# Patient Record
Sex: Male | Born: 1985 | Race: Black or African American | Hispanic: No | Marital: Single | Smoking: Current every day smoker
Health system: Southern US, Community
[De-identification: ages and names within clinical notes are randomized; demographics above are authoritative.]

## PROBLEM LIST (undated history)

## (undated) DIAGNOSIS — Z789 Other specified health status: Secondary | ICD-10-CM

## (undated) HISTORY — PX: LEG SURGERY: SHX1003

---

## 2008-03-03 ENCOUNTER — Emergency Department (HOSPITAL_COMMUNITY): Admission: EM | Admit: 2008-03-03 | Discharge: 2008-03-03 | Payer: Self-pay | Admitting: Emergency Medicine

## 2009-06-22 ENCOUNTER — Emergency Department (HOSPITAL_COMMUNITY): Admission: EM | Admit: 2009-06-22 | Discharge: 2009-06-23 | Payer: Self-pay | Admitting: Emergency Medicine

## 2020-02-05 ENCOUNTER — Other Ambulatory Visit: Payer: Self-pay

## 2020-02-05 ENCOUNTER — Encounter (HOSPITAL_COMMUNITY): Payer: Self-pay | Admitting: Emergency Medicine

## 2020-02-05 ENCOUNTER — Emergency Department (HOSPITAL_COMMUNITY)
Admission: EM | Admit: 2020-02-05 | Discharge: 2020-02-06 | Disposition: A | Discharge status: Home / Self Care | Payer: Self-pay | Attending: Emergency Medicine | Admitting: Emergency Medicine

## 2020-02-05 DIAGNOSIS — M5432 Sciatica, left side: Secondary | ICD-10-CM | POA: Insufficient documentation
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
## 2020-02-06 ENCOUNTER — Emergency Department (HOSPITAL_COMMUNITY): Payer: Self-pay

## 2020-02-06 MED ORDER — PREDNISONE 20 MG PO TABS: ORAL_TABLET | ORAL | 0 refills | Status: DC

## 2020-02-06 MED ORDER — PREDNISONE 20 MG PO TABS
60.0000 mg | ORAL_TABLET | Freq: Once | ORAL | Status: AC
  Administered 2020-02-06: 09:00:00 60 mg via ORAL
  Filled 2020-02-06: qty 3

## 2020-02-06 MED ORDER — ACETAMINOPHEN 500 MG PO TABS
1000.0000 mg | ORAL_TABLET | Freq: Once | ORAL | Status: AC
  Administered 2020-02-06: 1000 mg via ORAL
  Filled 2020-02-06: qty 2

## 2020-02-06 NOTE — Discharge Instructions (Addendum)
It was our pleasure to provide your ER care today - we hope that you feel better.  Your xrays look good/normal.   Take prednisone as prescribed. Take acetaminophen as need for pain.  Follow up with primary care doctor in the next 2-3 weeks.   Return to ER if worse, new symptoms, fevers, numbness or weakness, intractable pain, problems with normal bowel or bladder function, leg swelling, or other concern.

## 2020-02-06 NOTE — ED Notes (Signed)
Mother, Ceejay Kegley would like an update [phone]

## 2020-02-06 NOTE — ED Provider Notes (Signed)
[facility] EMERGENCY DEPARTMENT Provider Note   CSN: [id number] Arrival date & time: 02/05/20  1730     History Chief Complaint  Patient presents with  . Leg Pain    Duane King is a 34 y.o. male.  Patient c/o pain to posterior/lateral aspect left upper leg/buttock area, radiating down leg for the past couple weeks. Symptoms acute onset, moderate, persistent, constant, dull to sharp. Denies recent injury or strain to leg or back. Works in [employer], and job involves lifting/bending. No hx ddd. Denies leg swelling or redness. No numbness/weakness, or loss of normal functional ability. No gi/gu c/o. No fevers.   The history is provided by the patient.  Leg Pain Associated symptoms: no back pain and no fever        History reviewed. No pertinent past medical history.  There are no problems to display for this patient.   History reviewed. No pertinent surgical history.     No family history on file.  Social History   Tobacco Use  . Smoking status: Current Every Day Smoker  . Smokeless tobacco: Never Used  Substance Use Topics  . Alcohol use: Yes  . Drug use: Yes    Types: Marijuana    Home Medications Prior to Admission medications   Not on File    Allergies    Patient has no known allergies.  Review of Systems   Review of Systems  Constitutional: Negative for fever.  HENT: Negative for sore throat.   Eyes: Negative for redness.  Respiratory: Negative for shortness of breath.   Cardiovascular: Negative for chest pain and leg swelling.  Gastrointestinal: Negative for abdominal pain.  Genitourinary:       No urinary retention or incontinence.   Musculoskeletal: Negative for back pain.  Skin: Negative for rash.  Neurological: Negative for weakness and numbness.  Hematological: Does not bruise/bleed easily.  Psychiatric/Behavioral: Negative for confusion.    Physical Exam Updated Vital Signs BP 125/62 (BP Location: Right Arm)    Pulse 71   Temp 98.3 F (36.8 C) (Oral)   Resp 18   Ht 1.702 m (5\' 7" )   Wt 72.6 kg   SpO2 98%   BMI 25.06 kg/m   Physical Exam Vitals and nursing note reviewed.  Constitutional:      Appearance: Normal appearance. He is well-developed.  HENT:     Head: Atraumatic.     Nose: Nose normal.     Mouth/Throat:     Mouth: Mucous membranes are moist.  Eyes:     General: No scleral icterus.    Conjunctiva/sclera: Conjunctivae normal.  Neck:     Trachea: No tracheal deviation.  Cardiovascular:     Rate and Rhythm: Normal rate.     Pulses: Normal pulses.  Pulmonary:     Effort: Pulmonary effort is normal. No accessory muscle usage or respiratory distress.  Abdominal:     General: There is no distension.     Palpations: Abdomen is soft.     Tenderness: There is no abdominal tenderness.  Genitourinary:    Comments: No cva tenderness. Musculoskeletal:        General: No swelling.     Cervical back: Neck supple.     Comments: T/L/S spine non tender, aligned. Left lumbar and left sciatic notch area tenderness. Good passive rom left hip and knee without pain. Left dp/pt 2+. Straight leg raise + on left.   Skin:    General: Skin is warm and dry.  Findings: No rash.  Neurological:     Mental Status: He is alert.     Comments: Alert, speech clear. LLE motor intact, strength 5/5. sens grossly intact. Steady gait.   Psychiatric:        Mood and Affect: Mood normal.     ED Results / Procedures / Treatments   Labs (all labs ordered are listed, but only abnormal results are displayed) Labs Reviewed - No data to display  EKG None  Radiology DG Lumbar Spine Complete  Result Date: 02/06/2020 CLINICAL DATA:  Pain EXAM: LUMBAR SPINE - COMPLETE 4+ VIEW COMPARISON:  5/16/9 FINDINGS: There is no evidence of lumbar spine fracture. Alignment is normal. Intervertebral disc spaces are maintained. IMPRESSION: Negative. Electronically Signed   By: Signa Kell M.D.   On: 02/06/2020  09:29    Procedures Procedures (including critical care time)  Medications Ordered in ED Medications  predniSONE (DELTASONE) tablet 60 mg (has no administration in time range)  acetaminophen (TYLENOL) tablet 1,000 mg (has no administration in time range)    ED Course  I have reviewed the triage vital signs and the nursing notes.  Pertinent labs & imaging results that were available during my care of the patient were reviewed by me and considered in my medical decision making (see chart for details).    MDM Rules/Calculators/A&P                      Imaging ordered.   Reviewed nursing notes and prior charts for additional history.   No meds pta. Acetaminophen po. Prednisone po.   Xrays reviewed/interpreted by me - no fx.   Discussed possible sciatic pain with pt.   Patient appears stable for d/c.   Rec pcp f/u.  Return precautions provided.     Final Clinical Impression(s) / ED Diagnoses Final diagnoses:  None    Rx / DC Orders ED Discharge Orders    None       Cathren Laine, MD 02/06/20 1520

## 2020-02-06 NOTE — ED Notes (Signed)
Gave pt [place] sandwich, crackers and water

## 2020-04-25 ENCOUNTER — Other Ambulatory Visit: Payer: Self-pay

## 2020-04-25 ENCOUNTER — Encounter (HOSPITAL_COMMUNITY): Payer: Self-pay | Admitting: Emergency Medicine

## 2020-04-25 ENCOUNTER — Emergency Department (HOSPITAL_COMMUNITY)
Admission: EM | Admit: 2020-04-25 | Discharge: 2020-04-26 | Disposition: A | Discharge status: Home / Self Care | Payer: Self-pay | Attending: Emergency Medicine | Admitting: Emergency Medicine

## 2020-04-25 DIAGNOSIS — M79605 Pain in left leg: Secondary | ICD-10-CM | POA: Insufficient documentation

## 2020-04-25 DIAGNOSIS — M25552 Pain in left hip: Secondary | ICD-10-CM | POA: Insufficient documentation

## 2020-04-25 DIAGNOSIS — F17200 Nicotine dependence, unspecified, uncomplicated: Secondary | ICD-10-CM | POA: Insufficient documentation

## 2020-04-25 NOTE — ED Triage Notes (Signed)
Patient reports " pinched nerve" at left thigh with pain for 2 weeks , denies injury or fall , ambulatory .

## 2020-04-26 MED ORDER — METHYLPREDNISOLONE SODIUM SUCC 125 MG IJ SOLR
125.0000 mg | Freq: Once | INTRAMUSCULAR | Status: AC
  Administered 2020-04-26: 125 mg via INTRAMUSCULAR
  Filled 2020-04-26: qty 2

## 2020-04-26 MED ORDER — OXYCODONE-ACETAMINOPHEN 5-325 MG PO TABS
1.0000 | ORAL_TABLET | Freq: Once | ORAL | Status: AC
  Administered 2020-04-26: 1 via ORAL
  Filled 2020-04-26: qty 1

## 2020-04-26 MED ORDER — PREDNISONE 20 MG PO TABS: ORAL_TABLET | ORAL | 0 refills | Status: DC

## 2020-04-26 NOTE — ED Provider Notes (Signed)
[facility] EMERGENCY DEPARTMENT Provider Note   CSN: [id number] Arrival date & time: 04/25/20  2146     History No chief complaint on file.   Duane King is a 34 y.o. male.  The history is provided by the patient.  Leg Pain Location:  Hip and leg Leg location:  L upper leg Pain details:    Quality:  Aching and sharp   Radiates to:  Does not radiate   Severity:  No pain Chronicity:  New Prior injury to area:  No      History reviewed. No pertinent past medical history.  There are no problems to display for this patient.   History reviewed. No pertinent surgical history.     No family history on file.  Social History   Tobacco Use  . Smoking status: Current Every Day Smoker  . Smokeless tobacco: Never Used  Substance Use Topics  . Alcohol use: Yes  . Drug use: Yes    Types: Marijuana    Home Medications Prior to Admission medications   Medication Sig Start Date End Date Taking? Authorizing Provider  predniSONE (DELTASONE) 20 MG tablet 3 tabs po daily x 3 days, then 2 tabs x 3 days, then 1.5 tabs x 3 days, then 1 tab x 3 days, then 0.5 tabs x 3 days 04/26/20   , Barbara Cower, MD    Allergies    Patient has no known allergies.  Review of Systems   Review of Systems  All other systems reviewed and are negative.   Physical Exam Updated Vital Signs BP (!) 98/56   Pulse 82   Temp 97.7 F (36.5 C)   Resp 15   Ht 5\' 8"  (1.727 m)   Wt 80 kg   SpO2 94%   BMI 26.82 kg/m   Physical Exam Vitals and nursing note reviewed.  Constitutional:      Appearance: He is well-developed.  HENT:     Head: Normocephalic and atraumatic.     Nose: No congestion or rhinorrhea.     Mouth/Throat:     Mouth: Mucous membranes are moist.     Pharynx: Oropharynx is clear.  Eyes:     Pupils: Pupils are equal, round, and reactive to light.  Cardiovascular:     Rate and Rhythm: Normal rate.  Pulmonary:     Effort: Pulmonary effort is normal. No  respiratory distress.  Abdominal:     General: Abdomen is flat. There is no distension.  Musculoskeletal:        General: Normal range of motion.     Cervical back: Normal range of motion.  Skin:    General: Skin is warm and dry.     Coloration: Skin is not jaundiced or pale.  Neurological:     General: No focal deficit present.     Mental Status: He is alert. Mental status is at baseline.     Cranial Nerves: No cranial nerve deficit.     Sensory: No sensory deficit.     Motor: No weakness.     Coordination: Coordination normal.     Gait: Gait normal.     ED Results / Procedures / Treatments   Labs (all labs ordered are listed, but only abnormal results are displayed) Labs Reviewed - No data to display  EKG None  Radiology No results found.  Procedures Procedures (including critical care time)  Medications Ordered in ED Medications  methylPREDNISolone sodium succinate (SOLU-MEDROL) 125 mg/2 mL injection 125 mg (125  mg Intramuscular Given 04/26/20 0554)  oxyCODONE-acetaminophen (PERCOCET/ROXICET) 5-325 MG per tablet 1 tablet (1 tablet Oral Given 04/26/20 0554)    ED Course  I have reviewed the triage vital signs and the nursing notes.  Pertinent labs & imaging results that were available during my care of the patient were reviewed by me and considered in my medical decision making (see chart for details). Recurrent sciatic apain after mes ran out. Seems worse than previously but no motor deficits or other new deficits. No new trauma. No other red flags to indicate need for imaging or further workup. Will restart steroids and NSG FU.   MDM Rules/Calculators/A&P                           Final Clinical Impression(s) / ED Diagnoses Final diagnoses:  Pain of left lower extremity    Rx / DC Orders ED Discharge Orders         Ordered    predniSONE (DELTASONE) 20 MG tablet     Discontinue  Reprint     04/26/20 0554           , Barbara Cower, MD 04/26/20 1941

## 2021-02-22 IMAGING — CR DG LUMBAR SPINE COMPLETE 4+V
5 series · 5 of 5 positions shown · non-contrast
Comparison: [DATE]

CLINICAL DATA: Pain

EXAM:
LUMBAR SPINE - COMPLETE 4+ VIEW

[l-spine ap]
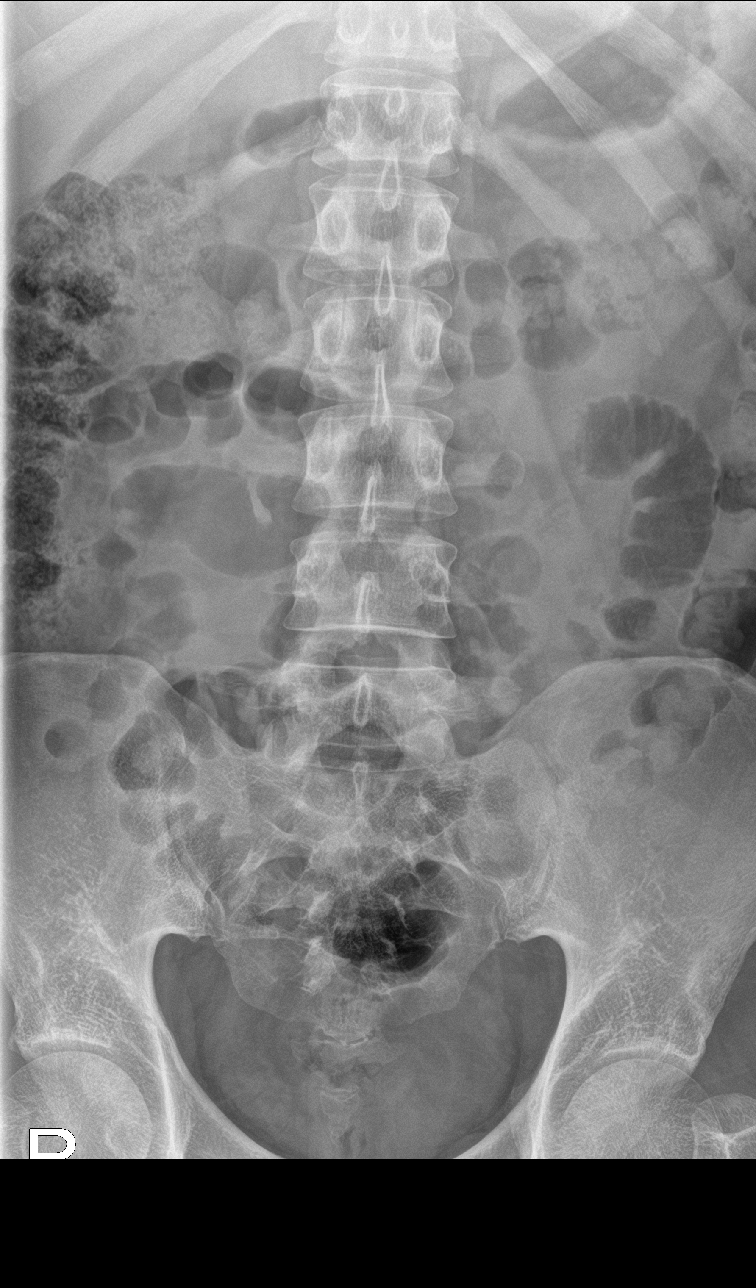

[l-spine obl (1 of 2)]
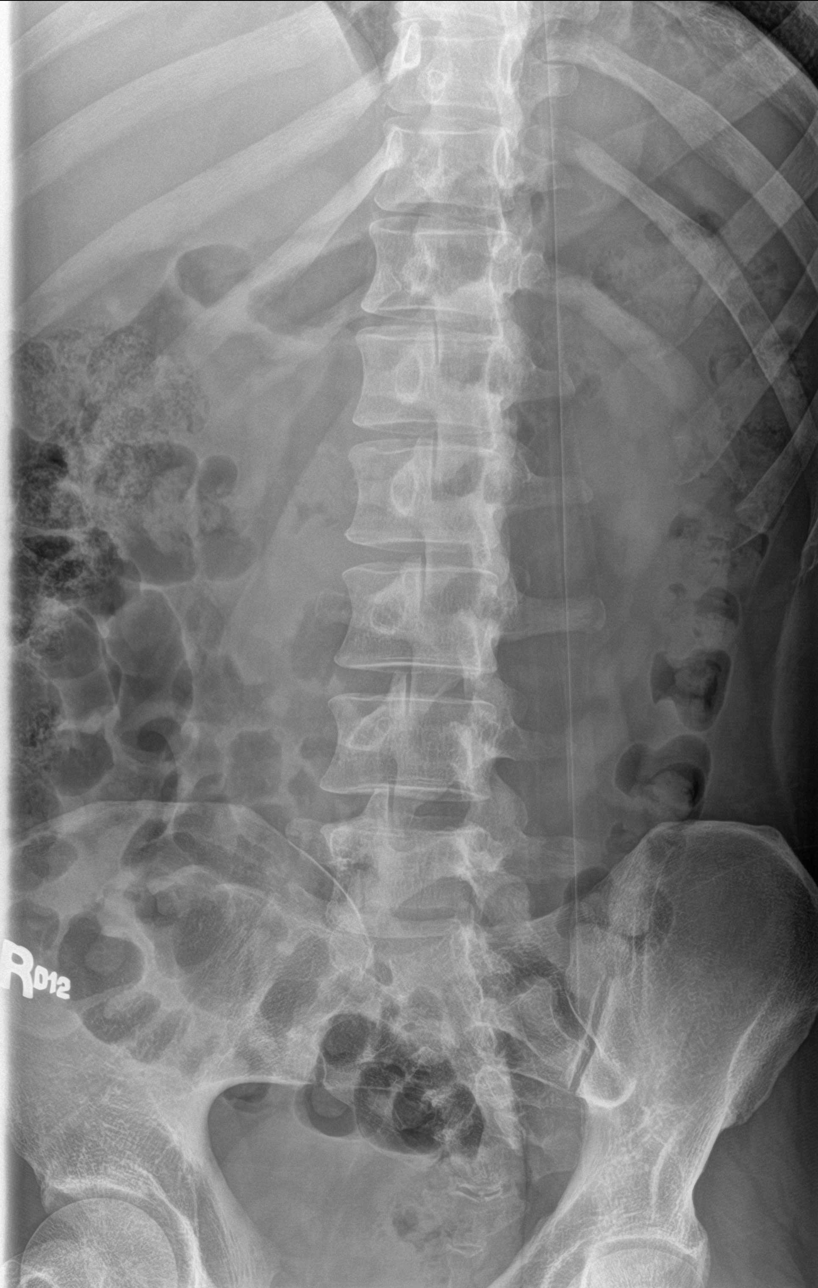

[l-spine obl (2 of 2)]
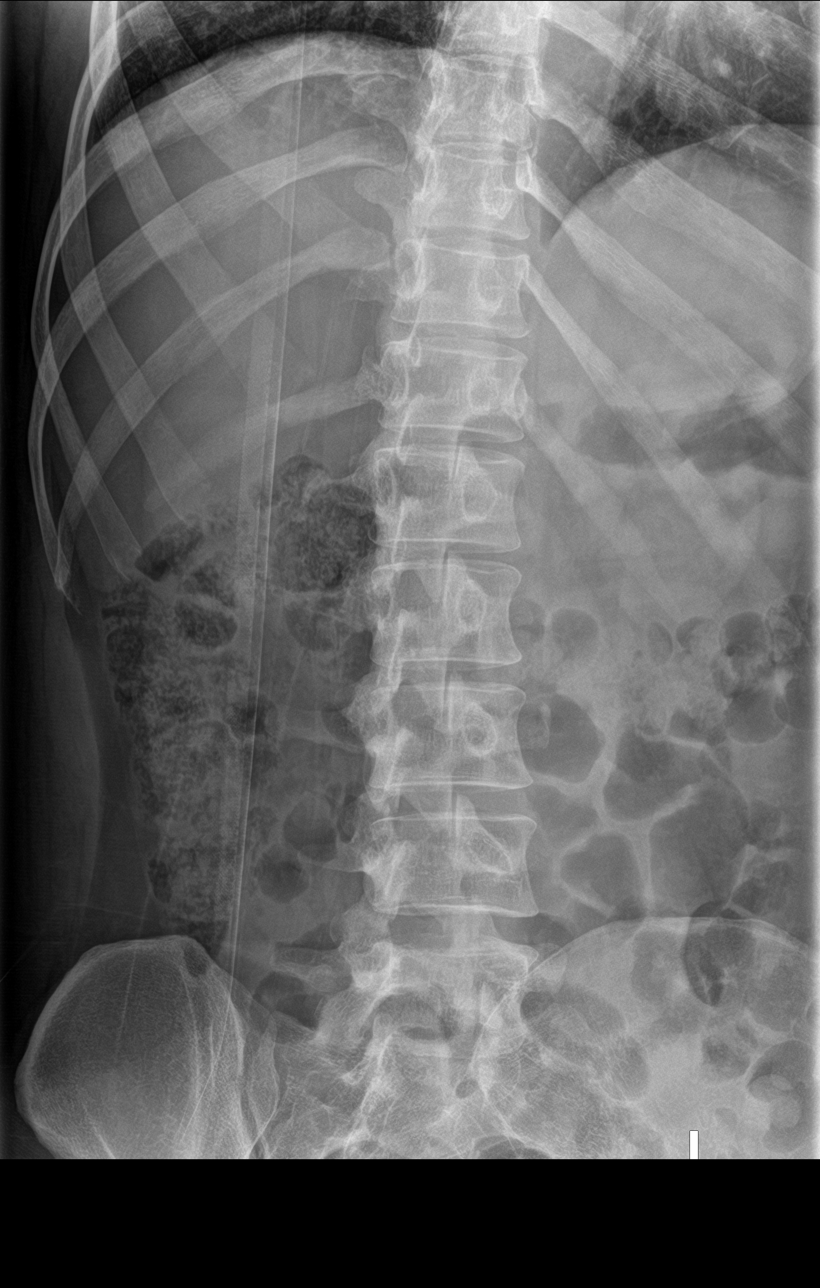

[l-spine lat]
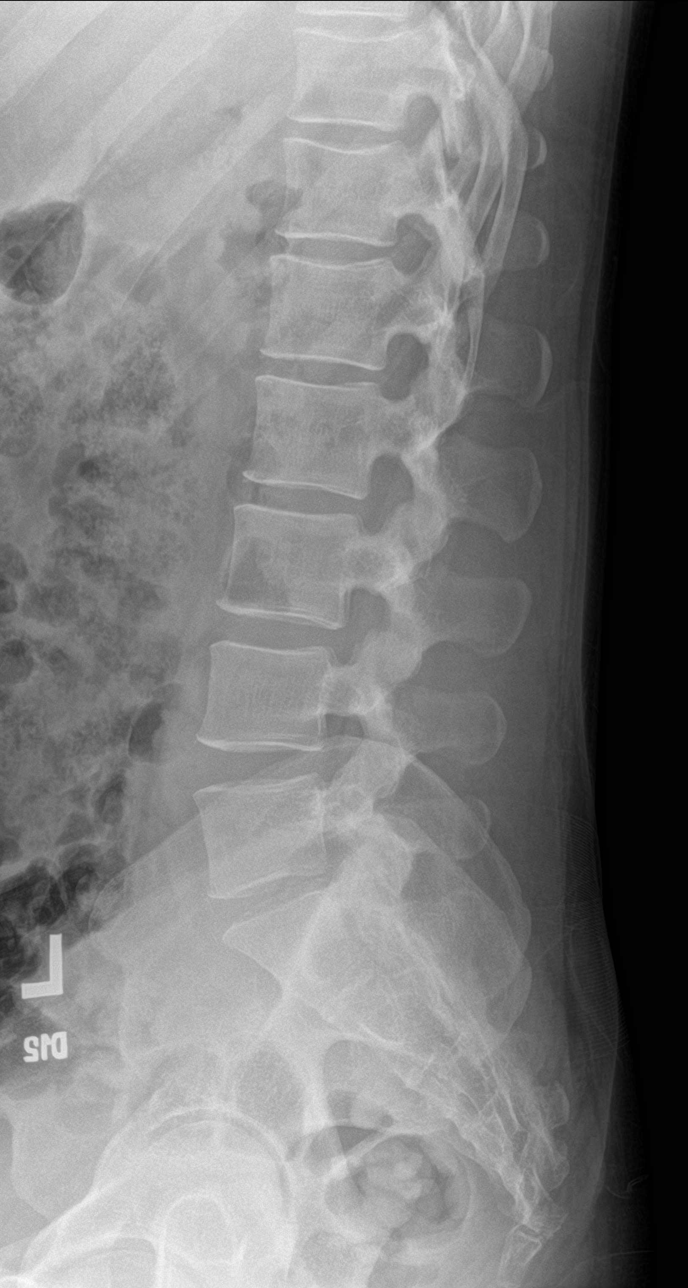

[l-spine spot]
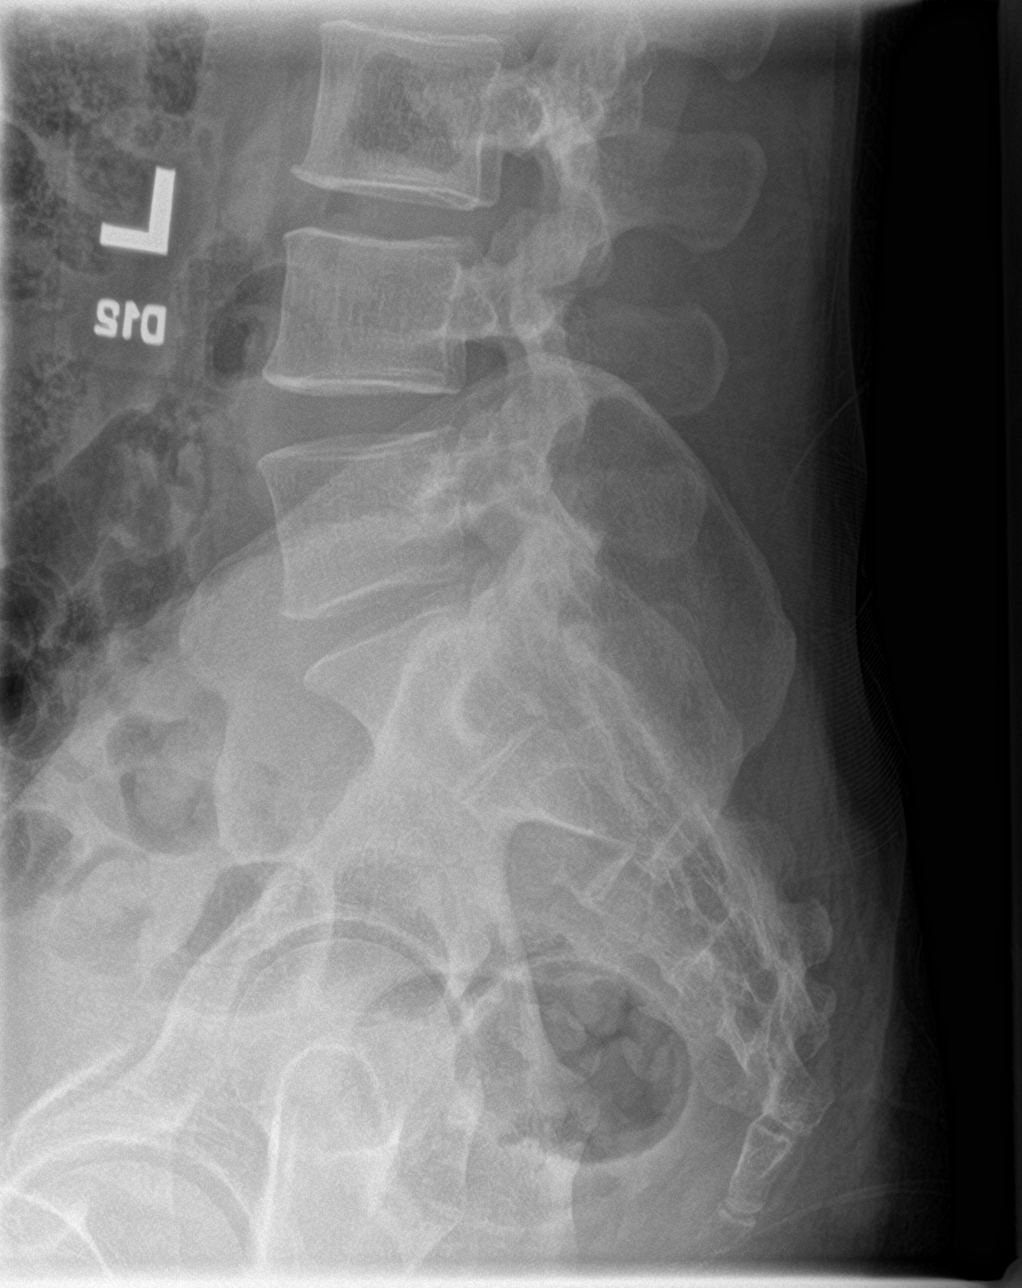

[5 of 5 positions shown; findings below may reference images not displayed]

FINDINGS: There is no evidence of lumbar spine fracture. Alignment is normal.
Intervertebral disc spaces are maintained.
IMPRESSION: Negative.

## 2021-04-01 ENCOUNTER — Ambulatory Visit
Admission: EM | Admit: 2021-04-01 | Discharge: 2021-04-01 | Disposition: A | Discharge status: Home / Self Care | Payer: Self-pay | Attending: Family Medicine | Admitting: Family Medicine

## 2021-04-01 ENCOUNTER — Other Ambulatory Visit: Payer: Self-pay

## 2021-04-01 DIAGNOSIS — R112 Nausea with vomiting, unspecified: Secondary | ICD-10-CM

## 2021-04-01 MED ORDER — ONDANSETRON 4 MG PO TBDP
4.0000 mg | ORAL_TABLET | Freq: Once | ORAL | Status: AC
  Administered 2021-04-01: 4 mg via ORAL

## 2021-04-01 MED ORDER — ONDANSETRON 4 MG PO TBDP: 4.0000 mg | ORAL_TABLET | Freq: Three times a day (TID) | ORAL | 0 refills | Status: DC | PRN

## 2021-04-01 NOTE — ED Provider Notes (Signed)
[facility]    CSN: [id number] Arrival date & time: 04/01/21  1657      History   Chief Complaint Chief Complaint  Patient presents with   Emesis   Nausea    HPI Duane King is a 35 y.o. male.   HPI Patient presents 1 episode of vomiting this morning.  He has experienced intermittent nausea throughout the day but reports at present the nausea has resolved.  Due to his symptoms he missed excuse to return back to denies any fever.  Denies any known sick contacts.  Has been able to tolerate fluids and food today.  Denies any abdominal pain.    History reviewed. No pertinent past medical history.  There are no problems to display for this patient.   History reviewed. No pertinent surgical history.     Home Medications    Prior to Admission medications   Not on File    Family History History reviewed. No pertinent family history.  Social History Social History   Tobacco Use   Smoking status: Every Day    Pack years: 0.00    Types: Cigarettes   Smokeless tobacco: Never  Substance Use Topics   Alcohol use: Yes    Comment: social   Drug use: Not Currently     Allergies   Patient has no known allergies.   Review of Systems Review of Systems Pertinent negatives listed in HPI  Physical Exam Triage Vital Signs ED Triage Vitals  Enc Vitals Group     BP 04/01/21 1927 (!) 144/89     Pulse Rate 04/01/21 1927 67     Resp 04/01/21 1927 18     Temp 04/01/21 1927 98.4 F (36.9 C)     Temp Source 04/01/21 1927 Oral     SpO2 04/01/21 1927 96 %     Weight --      Height --      Head Circumference --      Peak Flow --      Pain Score 04/01/21 1942 0     Pain Loc --      Pain Edu? --      Excl. in GC? --    No data found.  Updated Vital Signs BP (!) 144/89 (BP Location: Left Arm)   Pulse 67   Temp 98.4 F (36.9 C) (Oral)   Resp 18   SpO2 96%   Visual Acuity Right Eye Distance:   Left Eye Distance:   Bilateral Distance:     Right Eye Near:   Left Eye Near:    Bilateral Near:     Physical Exam General appearance: alert, well developed, well nourished, cooperative  Head: Normocephalic, without obvious abnormality, atraumatic Respiratory: Respirations even and unlabored, normal respiratory rate Heart: Rate and rhythm normal. No gallop or murmurs noted on exam  Abdomen: BS +, no distention, no rebound tenderness, or no mass Extremities: No gross deformities Skin: Skin color, texture, turgor normal. No rashes seen  Psych: Appropriate mood and affect. Neurologic: GCS 15, normal coordination, normal gait  UC Treatments / Results  Labs (all labs ordered are listed, but only abnormal results are displayed) Labs Reviewed - No data to display  EKG   Radiology No results found.  Procedures Procedures (including critical care time)  Medications Ordered in UC Medications - No data to display  Initial Impression / Assessment and Plan / UC Course  I have reviewed the triage vital signs and the nursing notes.  Pertinent  labs & imaging results that were available during my care of the patient were reviewed by me and considered in my medical decision making (see chart for details).    Work note provided.  Prescribed Zofran 4 mg.  Hours as needed for any nausea for 1 symptoms.  Patient is afebrile.  He is generally well-appearing and has no signs of an active infections or illness.  He is medically cleared to return back to work tomorrow. Final Clinical Impressions(s) / UC Diagnoses   Final diagnoses:  Intractable vomiting with nausea, unspecified vomiting type   Discharge Instructions   None    ED Prescriptions     Medication Sig Dispense Auth. Provider   ondansetron (ZOFRAN ODT) 4 MG disintegrating tablet Take 1 tablet (4 mg total) by mouth every 8 (eight) hours as needed for nausea or vomiting. 20 tablet Bing Neighbors, FNP      PDMP not reviewed this encounter.   Bing Neighbors,  FNP 04/06/21 [phone]

## 2021-04-01 NOTE — ED Triage Notes (Signed)
Pt states woke up this am and vomit x1. States has felt nausea all day. States will need a work note.

## 2023-05-23 ENCOUNTER — Emergency Department (HOSPITAL_COMMUNITY): Payer: Medicaid Other

## 2023-05-23 ENCOUNTER — Inpatient Hospital Stay (HOSPITAL_COMMUNITY)
Admission: EM | Admit: 2023-05-23 | Discharge: 2023-05-27 | DRG: 603 | Disposition: A | Discharge status: Home / Self Care | Payer: Medicaid Other | Attending: Internal Medicine | Admitting: Internal Medicine

## 2023-05-23 DIAGNOSIS — M659 Synovitis and tenosynovitis, unspecified: Secondary | ICD-10-CM

## 2023-05-23 DIAGNOSIS — S61218A Laceration without foreign body of other finger without damage to nail, initial encounter: Secondary | ICD-10-CM | POA: Diagnosis present

## 2023-05-23 DIAGNOSIS — F1910 Other psychoactive substance abuse, uncomplicated: Secondary | ICD-10-CM | POA: Diagnosis present

## 2023-05-23 DIAGNOSIS — Z1623 Resistance to quinolones and fluoroquinolones: Secondary | ICD-10-CM | POA: Diagnosis present

## 2023-05-23 DIAGNOSIS — S6991XA Unspecified injury of right wrist, hand and finger(s), initial encounter: Principal | ICD-10-CM

## 2023-05-23 DIAGNOSIS — M65141 Other infective (teno)synovitis, right hand: Secondary | ICD-10-CM | POA: Diagnosis present

## 2023-05-23 DIAGNOSIS — B9562 Methicillin resistant Staphylococcus aureus infection as the cause of diseases classified elsewhere: Secondary | ICD-10-CM | POA: Diagnosis present

## 2023-05-23 DIAGNOSIS — Z1629 Resistance to other single specified antibiotic: Secondary | ICD-10-CM | POA: Diagnosis present

## 2023-05-23 DIAGNOSIS — A4901 Methicillin susceptible Staphylococcus aureus infection, unspecified site: Secondary | ICD-10-CM

## 2023-05-23 DIAGNOSIS — F17210 Nicotine dependence, cigarettes, uncomplicated: Secondary | ICD-10-CM | POA: Diagnosis present

## 2023-05-23 DIAGNOSIS — L02511 Cutaneous abscess of right hand: Principal | ICD-10-CM | POA: Diagnosis present

## 2023-05-23 HISTORY — DX: Other specified health status: Z78.9

## 2023-05-23 LAB — CBC WITH DIFFERENTIAL/PLATELET
Abs Immature Granulocytes: 0.02 10*3/uL (ref 0.00–0.07)
Basophils Absolute: 0 10*3/uL (ref 0.0–0.1)
Basophils Relative: 0 %
Eosinophils Absolute: 0.1 10*3/uL (ref 0.0–0.5)
Eosinophils Relative: 2 %
HCT: 45.7 % (ref 39.0–52.0)
Hemoglobin: 14.5 g/dL (ref 13.0–17.0)
Immature Granulocytes: 0 %
Lymphocytes Relative: 23 %
Lymphs Abs: 1.8 10*3/uL (ref 0.7–4.0)
MCH: 26.9 pg (ref 26.0–34.0)
MCHC: 31.7 g/dL (ref 30.0–36.0)
MCV: 84.6 fL (ref 80.0–100.0)
Monocytes Absolute: 0.9 10*3/uL (ref 0.1–1.0)
Monocytes Relative: 11 %
Neutro Abs: 4.8 10*3/uL (ref 1.7–7.7)
Neutrophils Relative %: 64 %
Platelets: 318 10*3/uL (ref 150–400)
RBC: 5.4 MIL/uL (ref 4.22–5.81)
RDW: 12 % (ref 11.5–15.5)
WBC: 7.6 10*3/uL (ref 4.0–10.5)
nRBC: 0 % (ref 0.0–0.2)

## 2023-05-23 LAB — BASIC METABOLIC PANEL
Anion gap: 15 (ref 5–15)
BUN: 10 mg/dL (ref 6–20)
CO2: 25 mmol/L (ref 22–32)
Calcium: 9.3 mg/dL (ref 8.9–10.3)
Chloride: 99 mmol/L (ref 98–111)
Creatinine, Ser: 0.86 mg/dL (ref 0.61–1.24)
GFR, Estimated: >60 mL/min (ref >60)
Glucose, Bld: 101 mg/dL — ABNORMAL HIGH (ref 70–99)
Potassium: 3.7 mmol/L (ref 3.5–5.1)
Sodium: 139 mmol/L (ref 135–145)

## 2023-05-23 LAB — I-STAT CG4 LACTIC ACID, ED: Lactic Acid, Venous: 1.3 mmol/L (ref 0.5–1.9)

## 2023-05-24 ENCOUNTER — Observation Stay (HOSPITAL_COMMUNITY): Payer: Medicaid Other | Admitting: Certified Registered Nurse Anesthetist

## 2023-05-24 ENCOUNTER — Encounter (HOSPITAL_COMMUNITY): Payer: Self-pay

## 2023-05-24 ENCOUNTER — Observation Stay (HOSPITAL_BASED_OUTPATIENT_CLINIC_OR_DEPARTMENT_OTHER): Payer: Medicaid Other | Admitting: Certified Registered Nurse Anesthetist

## 2023-05-24 ENCOUNTER — Other Ambulatory Visit: Payer: Self-pay

## 2023-05-24 ENCOUNTER — Encounter (HOSPITAL_COMMUNITY)
Admission: EM | Disposition: A | Discharge status: Home / Self Care | Payer: Self-pay | Source: Home / Self Care | Attending: Internal Medicine

## 2023-05-24 DIAGNOSIS — L089 Local infection of the skin and subcutaneous tissue, unspecified: Secondary | ICD-10-CM | POA: Diagnosis not present

## 2023-05-24 DIAGNOSIS — M65841 Other synovitis and tenosynovitis, right hand: Secondary | ICD-10-CM | POA: Diagnosis not present

## 2023-05-24 DIAGNOSIS — M659 Synovitis and tenosynovitis, unspecified: Secondary | ICD-10-CM | POA: Diagnosis present

## 2023-05-24 HISTORY — PX: I & D EXTREMITY: SHX5045

## 2023-05-24 LAB — AEROBIC/ANAEROBIC CULTURE W GRAM STAIN (SURGICAL/DEEP WOUND)

## 2023-05-24 SURGERY — IRRIGATION AND DEBRIDEMENT EXTREMITY
Anesthesia: General | Site: Hand | Laterality: Right

## 2023-05-24 MED ORDER — OXYCODONE HCL 5 MG PO TABS
5.0000 mg | ORAL_TABLET | Freq: Four times a day (QID) | ORAL | Status: DC | PRN
  Administered 2023-05-24 – 2023-05-25 (×3): 5 mg via ORAL
  Filled 2023-05-24 (×3): qty 1

## 2023-05-24 MED ORDER — FENTANYL CITRATE (PF) 250 MCG/5ML IJ SOLN
INTRAMUSCULAR | Status: DC | PRN
  Administered 2023-05-24 (×2): 50 ug via INTRAVENOUS

## 2023-05-24 MED ORDER — VANCOMYCIN HCL IN DEXTROSE 1-5 GM/200ML-% IV SOLN
1000.0000 mg | Freq: Three times a day (TID) | INTRAVENOUS | Status: DC
  Administered 2023-05-24 – 2023-05-27 (×9): 1000 mg via INTRAVENOUS
  Filled 2023-05-24 (×9): qty 200

## 2023-05-24 MED ORDER — MIDAZOLAM HCL 2 MG/2ML IJ SOLN
INTRAMUSCULAR | Status: AC
  Filled 2023-05-24: qty 2

## 2023-05-24 MED ORDER — ONDANSETRON HCL 4 MG/2ML IJ SOLN: 4.0000 mg | Freq: Once | INTRAMUSCULAR | Status: DC | PRN

## 2023-05-24 MED ORDER — ACETAMINOPHEN 325 MG PO TABS: 325.0000 mg | ORAL_TABLET | ORAL | Status: DC | PRN

## 2023-05-24 MED ORDER — PHENYLEPHRINE 80 MCG/ML (10ML) SYRINGE FOR IV PUSH (FOR BLOOD PRESSURE SUPPORT)
PREFILLED_SYRINGE | INTRAVENOUS | Status: DC | PRN
  Administered 2023-05-24: 80 ug via INTRAVENOUS
  Administered 2023-05-24 (×2): 160 ug via INTRAVENOUS

## 2023-05-24 MED ORDER — FENTANYL CITRATE (PF) 100 MCG/2ML IJ SOLN: 25.0000 ug | INTRAMUSCULAR | Status: DC | PRN

## 2023-05-24 MED ORDER — METRONIDAZOLE 500 MG/100ML IV SOLN
500.0000 mg | Freq: Three times a day (TID) | INTRAVENOUS | Status: DC
  Administered 2023-05-24 – 2023-05-26 (×5): 500 mg via INTRAVENOUS
  Filled 2023-05-24 (×5): qty 100

## 2023-05-24 MED ORDER — LIDOCAINE HCL (PF) 1 % IJ SOLN
INTRAMUSCULAR | Status: DC | PRN
  Administered 2023-05-24: 5 mL

## 2023-05-24 MED ORDER — ACETAMINOPHEN 160 MG/5ML PO SOLN: 325.0000 mg | ORAL | Status: DC | PRN

## 2023-05-24 MED ORDER — LIDOCAINE HCL (PF) 1 % IJ SOLN
INTRAMUSCULAR | Status: AC
  Filled 2023-05-24: qty 30

## 2023-05-24 MED ORDER — OXYCODONE HCL 5 MG PO TABS: 5.0000 mg | ORAL_TABLET | Freq: Once | ORAL | Status: DC | PRN

## 2023-05-24 MED ORDER — FENTANYL CITRATE (PF) 250 MCG/5ML IJ SOLN
INTRAMUSCULAR | Status: AC
  Filled 2023-05-24: qty 5

## 2023-05-24 MED ORDER — HYDROMORPHONE HCL 1 MG/ML IJ SOLN
1.0000 mg | Freq: Once | INTRAMUSCULAR | Status: AC
  Administered 2023-05-24: 1 mg via INTRAVENOUS
  Filled 2023-05-24: qty 1

## 2023-05-24 MED ORDER — BUPIVACAINE HCL (PF) 0.25 % IJ SOLN
INTRAMUSCULAR | Status: DC | PRN
  Administered 2023-05-24: 5 mL

## 2023-05-24 MED ORDER — MEPERIDINE HCL 25 MG/ML IJ SOLN: 6.2500 mg | INTRAMUSCULAR | Status: DC | PRN

## 2023-05-24 MED ORDER — SODIUM CHLORIDE 0.9 % IV SOLN
2.0000 g | INTRAVENOUS | Status: DC
  Administered 2023-05-24 – 2023-05-26 (×2): 2 g via INTRAVENOUS
  Filled 2023-05-24 (×2): qty 20

## 2023-05-24 MED ORDER — LACTATED RINGERS IV SOLN: INTRAVENOUS | Status: DC

## 2023-05-24 MED ORDER — DEXAMETHASONE SODIUM PHOSPHATE 10 MG/ML IJ SOLN
INTRAMUSCULAR | Status: DC | PRN
  Administered 2023-05-24: 10 mg via INTRAVENOUS

## 2023-05-24 MED ORDER — PROPOFOL 10 MG/ML IV BOLUS
INTRAVENOUS | Status: AC
  Filled 2023-05-24: qty 20

## 2023-05-24 MED ORDER — MIDAZOLAM HCL 2 MG/2ML IJ SOLN
INTRAMUSCULAR | Status: DC | PRN
  Administered 2023-05-24 (×2): 2 mg via INTRAVENOUS

## 2023-05-24 MED ORDER — POVIDONE-IODINE 10 % EX SWAB
2.0000 | Freq: Once | CUTANEOUS | Status: AC
  Administered 2023-05-24: 2 via TOPICAL

## 2023-05-24 MED ORDER — CHLORHEXIDINE GLUCONATE 0.12 % MT SOLN
15.0000 mL | Freq: Once | OROMUCOSAL | Status: AC
  Administered 2023-05-24: 15 mL via OROMUCOSAL

## 2023-05-24 MED ORDER — LIDOCAINE 2% (20 MG/ML) 5 ML SYRINGE
INTRAMUSCULAR | Status: DC | PRN
  Administered 2023-05-24: 60 mg via INTRAVENOUS

## 2023-05-24 MED ORDER — ORAL CARE MOUTH RINSE: 15.0000 mL | Freq: Once | OROMUCOSAL | Status: AC

## 2023-05-24 MED ORDER — CHLORHEXIDINE GLUCONATE 4 % EX SOLN
60.0000 mL | Freq: Once | CUTANEOUS | Status: DC
  Filled 2023-05-24: qty 60

## 2023-05-24 MED ORDER — DEXMEDETOMIDINE HCL IN NACL 80 MCG/20ML IV SOLN
INTRAVENOUS | Status: DC | PRN
  Administered 2023-05-24: 8 ug via INTRAVENOUS

## 2023-05-24 MED ORDER — SODIUM CHLORIDE 0.9 % IV SOLN
2.0000 g | Freq: Once | INTRAVENOUS | Status: AC
  Administered 2023-05-24: 2 g via INTRAVENOUS
  Filled 2023-05-24: qty 20

## 2023-05-24 MED ORDER — NICOTINE 7 MG/24HR TD PT24
7.0000 mg | MEDICATED_PATCH | Freq: Every day | TRANSDERMAL | Status: DC
  Administered 2023-05-24 – 2023-05-27 (×4): 7 mg via TRANSDERMAL
  Filled 2023-05-24 (×4): qty 1

## 2023-05-24 MED ORDER — BUPIVACAINE HCL (PF) 0.25 % IJ SOLN
INTRAMUSCULAR | Status: AC
  Filled 2023-05-24: qty 30

## 2023-05-24 MED ORDER — KETOROLAC TROMETHAMINE 30 MG/ML IJ SOLN
INTRAMUSCULAR | Status: DC | PRN
  Administered 2023-05-24: 30 mg via INTRAVENOUS

## 2023-05-24 MED ORDER — MORPHINE SULFATE (PF) 4 MG/ML IV SOLN
4.0000 mg | Freq: Once | INTRAVENOUS | Status: AC
  Administered 2023-05-24: 4 mg via INTRAVENOUS
  Filled 2023-05-24: qty 1

## 2023-05-24 MED ORDER — SODIUM CHLORIDE 0.9 % IR SOLN
Status: DC | PRN
  Administered 2023-05-24: 1000 mL

## 2023-05-24 MED ORDER — CEFAZOLIN SODIUM-DEXTROSE 2-4 GM/100ML-% IV SOLN
2.0000 g | INTRAVENOUS | Status: AC
  Administered 2023-05-24: 2 g via INTRAVENOUS
  Filled 2023-05-24: qty 100

## 2023-05-24 MED ORDER — LIDOCAINE 2% (20 MG/ML) 5 ML SYRINGE
INTRAMUSCULAR | Status: AC
  Filled 2023-05-24: qty 5

## 2023-05-24 MED ORDER — OXYCODONE HCL 5 MG/5ML PO SOLN: 5.0000 mg | Freq: Once | ORAL | Status: DC | PRN

## 2023-05-24 MED ORDER — ONDANSETRON HCL 4 MG/2ML IJ SOLN
INTRAMUSCULAR | Status: AC
  Filled 2023-05-24: qty 2

## 2023-05-24 MED ORDER — ACETAMINOPHEN 500 MG PO TABS
1000.0000 mg | ORAL_TABLET | Freq: Four times a day (QID) | ORAL | Status: DC
  Administered 2023-05-24 – 2023-05-27 (×10): 1000 mg via ORAL
  Filled 2023-05-24 (×10): qty 2

## 2023-05-24 MED ORDER — DEXAMETHASONE SODIUM PHOSPHATE 10 MG/ML IJ SOLN
INTRAMUSCULAR | Status: AC
  Filled 2023-05-24: qty 1

## 2023-05-24 MED ORDER — ONDANSETRON HCL 4 MG/2ML IJ SOLN
INTRAMUSCULAR | Status: DC | PRN
Start: 2023-05-24 — End: 2023-05-24
  Administered 2023-05-24: 4 mg via INTRAVENOUS

## 2023-05-24 MED ORDER — PROPOFOL 10 MG/ML IV BOLUS
INTRAVENOUS | Status: DC | PRN
Start: 2023-05-24 — End: 2023-05-24
  Administered 2023-05-24: 280 mg via INTRAVENOUS

## 2023-05-24 MED ORDER — BUPIVACAINE HCL (PF) 0.25 % IJ SOLN
INTRAMUSCULAR | Status: AC
  Filled 2023-05-24: qty 10

## 2023-05-24 MED ORDER — IBUPROFEN 200 MG PO TABS
800.0000 mg | ORAL_TABLET | Freq: Three times a day (TID) | ORAL | Status: DC
  Administered 2023-05-24 – 2023-05-27 (×9): 800 mg via ORAL
  Filled 2023-05-24 (×4): qty 4
  Filled 2023-05-24: qty 1
  Filled 2023-05-24 (×4): qty 4

## 2023-05-24 MED ORDER — VANCOMYCIN HCL 1500 MG/300ML IV SOLN
1500.0000 mg | Freq: Once | INTRAVENOUS | Status: AC
  Administered 2023-05-24: 1500 mg via INTRAVENOUS
  Filled 2023-05-24: qty 300

## 2023-05-24 SURGICAL SUPPLY — 49 items
BAG COUNTER SPONGE SURGICOUNT (BAG) ×1 IMPLANT
BAG SPNG CNTER NS LX DISP (BAG) ×1
BNDG CMPR 5X3 KNIT ELC UNQ LF (GAUZE/BANDAGES/DRESSINGS) ×1
BNDG ELASTIC 3INX 5YD STR LF (GAUZE/BANDAGES/DRESSINGS) ×1 IMPLANT
BNDG ELASTIC 4X5.8 VLCR STR LF (GAUZE/BANDAGES/DRESSINGS) ×1 IMPLANT
BNDG GAUZE DERMACEA FLUFF 4 (GAUZE/BANDAGES/DRESSINGS) ×2 IMPLANT
BNDG GZE DERMACEA 4 6PLY (GAUZE/BANDAGES/DRESSINGS) ×1
CORD BIPOLAR FORCEPS 12FT (ELECTRODE) IMPLANT
COVER SURGICAL LIGHT HANDLE (MISCELLANEOUS) ×2 IMPLANT
CUFF TOURN SGL QUICK 18X4 (TOURNIQUET CUFF) ×1 IMPLANT
DRAPE SURG 17X23 STRL (DRAPES) ×1 IMPLANT
DRSG TELFA 3X8 NADH STRL (GAUZE/BANDAGES/DRESSINGS) IMPLANT
GAUZE PACKING IODOFORM 1/4X15 (PACKING) IMPLANT
GAUZE SPONGE 4X4 12PLY STRL (GAUZE/BANDAGES/DRESSINGS) ×2 IMPLANT
GAUZE XEROFORM 1X8 LF (GAUZE/BANDAGES/DRESSINGS) ×1 IMPLANT
GLOVE BIO SURGEON STRL SZ 6.5 (GLOVE) IMPLANT
GLOVE BIOGEL PI IND STRL 7.0 (GLOVE) IMPLANT
GLOVE SURG SYN 7.5 E (GLOVE) ×1 IMPLANT
GLOVE SURG SYN 7.5 PF PI (GLOVE) IMPLANT
GLOVE SURG SYN 8.0 (GLOVE) ×1 IMPLANT
GLOVE SURG SYN 8.0 PF PI (GLOVE) ×1 IMPLANT
GOWN STRL REUS W/ TWL LRG LVL3 (GOWN DISPOSABLE) ×1 IMPLANT
GOWN STRL REUS W/ TWL XL LVL3 (GOWN DISPOSABLE) ×1 IMPLANT
GOWN STRL REUS W/TWL LRG LVL3 (GOWN DISPOSABLE) ×1
GOWN STRL REUS W/TWL XL LVL3 (GOWN DISPOSABLE) ×1
KIT BASIN OR (CUSTOM PROCEDURE TRAY) ×1 IMPLANT
KIT TURNOVER KIT B (KITS) ×1 IMPLANT
MANIFOLD NEPTUNE II (INSTRUMENTS) ×1 IMPLANT
NDL HYPO 25X1 1.5 SAFETY (NEEDLE) ×1 IMPLANT
NEEDLE HYPO 25X1 1.5 SAFETY (NEEDLE) ×1 IMPLANT
NS IRRIG 1000ML POUR BTL (IV SOLUTION) ×1 IMPLANT
PACK ORTHO EXTREMITY (CUSTOM PROCEDURE TRAY) ×1 IMPLANT
PAD ARMBOARD 7.5X6 YLW CONV (MISCELLANEOUS) ×2 IMPLANT
PAD CAST 4YDX4 CTTN HI CHSV (CAST SUPPLIES) ×2 IMPLANT
PADDING CAST COTTON 4X4 STRL (CAST SUPPLIES) ×2
SET CYSTO W/LG BORE CLAMP LF (SET/KITS/TRAYS/PACK) IMPLANT
SPONGE T-LAP 18X18 ~~LOC~~+RFID (SPONGE) ×1 IMPLANT
SUCTION TUBE FRAZIER 10FR DISP (SUCTIONS) IMPLANT
SUT ETHILON 3 0 PS 1 (SUTURE) IMPLANT
SUT ETHILON 4 0 PS 2 18 (SUTURE) IMPLANT
SWAB COLLECTION DEVICE MRSA (MISCELLANEOUS) ×1 IMPLANT
SWAB CULTURE ESWAB REG 1ML (MISCELLANEOUS) IMPLANT
SYR 30ML LL (SYRINGE) IMPLANT
SYR CONTROL 10ML LL (SYRINGE) ×1 IMPLANT
TOWEL GREEN STERILE (TOWEL DISPOSABLE) ×1 IMPLANT
TOWEL GREEN STERILE FF (TOWEL DISPOSABLE) ×1 IMPLANT
TUBE CONNECTING 12X1/4 (SUCTIONS) ×1 IMPLANT
UNDERPAD 30X36 HEAVY ABSORB (UNDERPADS AND DIAPERS) ×1 IMPLANT
YANKAUER SUCT BULB TIP NO VENT (SUCTIONS) ×1 IMPLANT

## 2023-05-24 NOTE — Transfer of Care (Signed)
Immediate Anesthesia Transfer of Care Note  Patient: Duane King  Procedure(s) Performed: IRRIGATION AND DEBRIDEMENT RIGHT LONG FINGER (Right: Hand)  Patient Location: PACU  Anesthesia Type:General  Level of Consciousness: unresponsive  Airway & Oxygen Therapy: Patient Spontanous Breathing and Patient connected to face mask oxygen  Post-op Assessment: Report given to RN and Post -op Vital signs reviewed and stable  Post vital signs: Reviewed and stable  Last Vitals:  Vitals Value Taken Time  BP 98/57 05/24/23 1945  Temp    Pulse 70 05/24/23 1945  Resp 13 05/24/23 1945  SpO2 100 % 05/24/23 1945  Vitals shown include unfiled device data.  Last Pain:  Vitals:   05/24/23 1606  TempSrc:   PainSc: 5          Complications: No notable events documented.
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
## 2023-05-24 NOTE — Progress Notes (Signed)
08/05 Attempted visit at 2:24 patient was asleep.  Follow-up visit at 3:44 still asleep  Nurse states patient has a procedure scheduled for an hour and half from now, perhaps coming back in about an hour as patient will have to be awaken.
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
## 2023-05-24 NOTE — ED Notes (Signed)
Pt name called by nurse Shawna Orleans, RN)  to go back to see a doctor, no response. This tech pulled pt name off the floor per nurse request.

## 2023-05-24 NOTE — Anesthesia Preprocedure Evaluation (Addendum)
Anesthesia Evaluation  Patient identified by MRN, date of birth, ID band Patient awake    Reviewed: Allergy & Precautions, H&P , NPO status , Patient's Chart, lab work & pertinent test results  Airway Mallampati: II  TM Distance: >3 FB Neck ROM: Full    Dental no notable dental hx. (+) Teeth Intact, Dental Advisory Given   Pulmonary neg pulmonary ROS, Current Smoker   Pulmonary exam normal breath sounds clear to auscultation       Cardiovascular Exercise Tolerance: Good negative cardio ROS Normal cardiovascular exam Rhythm:Regular Rate:Normal     Neuro/Psych negative neurological ROS  negative psych ROS   GI/Hepatic negative GI ROS, Neg liver ROS,,,  Endo/Other  negative endocrine ROS    Renal/GU negative Renal ROS  negative genitourinary   Musculoskeletal negative musculoskeletal ROS (+)    Abdominal   Peds negative pediatric ROS (+)  Hematology negative hematology ROS (+)   Anesthesia Other Findings   Reproductive/Obstetrics negative OB ROS                             Anesthesia Physical Anesthesia Plan  ASA: 2  Anesthesia Plan: General   Post-op Pain Management: Toradol IV (intra-op)* and Ofirmev IV (intra-op)*   Induction: Intravenous  PONV Risk Score and Plan: 2 and Ondansetron and Dexamethasone  Airway Management Planned: LMA  Additional Equipment: None  Intra-op Plan:   Post-operative Plan: Extubation in OR  Informed Consent: I have reviewed the patients History and Physical, chart, labs and discussed the procedure including the risks, benefits and alternatives for the proposed anesthesia with the patient or authorized representative who has indicated his/her understanding and acceptance.       Plan Discussed with: CRNA, Surgeon and Anesthesiologist  Anesthesia Plan Comments: ( )        Anesthesia Quick Evaluation

## 2023-05-24 NOTE — Anesthesia Procedure Notes (Signed)
Procedure Name: LMA Insertion Date/Time: 05/24/2023 6:20 PM  Performed by: Kayleen Memos, CRNAPre-anesthesia Checklist: Patient identified, Emergency Drugs available, Suction available and Patient being monitored Patient Re-evaluated:Patient Re-evaluated prior to induction Oxygen Delivery Method: Circle System Utilized Preoxygenation: Pre-oxygenation with 100% oxygen Induction Type: IV induction Ventilation: Mask ventilation without difficulty LMA: LMA inserted LMA Size: 5.0 Number of attempts: 1 Airway Equipment and Method: Bite block Placement Confirmation: positive ETCO2 Tube secured with: Tape Dental Injury: Teeth and Oropharynx as per pre-operative assessment

## 2023-05-24 NOTE — Anesthesia Postprocedure Evaluation (Signed)
Anesthesia Post Note  Patient: Duane King  Procedure(s) Performed: IRRIGATION AND DEBRIDEMENT RIGHT LONG FINGER (Right: Hand)     Patient location during evaluation: PACU Anesthesia Type: General Level of consciousness: awake and alert Pain management: pain level controlled Vital Signs Assessment: post-procedure vital signs reviewed and stable Respiratory status: spontaneous breathing, nonlabored ventilation and respiratory function stable Cardiovascular status: stable and blood pressure returned to baseline Anesthetic complications: no   No notable events documented.  Last Vitals:  Vitals:   05/24/23 2015 05/24/23 2032  BP: 125/89 121/83  Pulse: 69 70  Resp: 16 14  Temp: 36.4 C 36.4 C  SpO2: 100% 100%    Last Pain:  Vitals:   05/24/23 2032  TempSrc: Oral  PainSc:                  [name]

## 2023-05-24 NOTE — Progress Notes (Signed)
ED Pharmacy Antibiotic Sign Off An antibiotic consult was received from an ED provider for vancomycin per pharmacy dosing for flexor tenosynovitis. A chart review was completed to assess appropriateness.   The following one time order(s) were placed:  Vancomycin 1500mg  IV x1  Further antibiotic and/or antibiotic pharmacy consults should be ordered by the admitting provider if indicated.   Thank you for allowing pharmacy to be a part of this patient's care.   Vernard Gambles, PharmD, BCPS   Clinical Pharmacist 05/24/23 4:47 AM

## 2023-05-24 NOTE — Plan of Care (Signed)
Patient alert/oriented X4 on admission. VSS upon admission and skin assessed with Maxie Better, RN. No skin issues noted besides edema and erythemia presented on R middle finger. Patient given oxycodone as needed for pain and tolerated IV flagyl and vancomycin. Patient signed consent for OR irrigation and debridement of R middle finger.

## 2023-05-24 NOTE — Progress Notes (Signed)
Pharmacy Antibiotic Note  Duane King is a 37 y.o. male admitted on 05/23/2023 presenting with tenosynovitis.  Pharmacy has been consulted for vancomycin dosing.  Vanc 1500 mg IV x 1 given in ED  Plan: Vancomycin 1g IV q 8h (eAUC 522) Monitor renal function, Cx and clinical progression to narrow Vancomycin levels as indicated   Height: 5\' 8"  (172.7 cm) Weight: 80 kg (176 lb 5.9 oz) IBW/kg (Calculated) : 68.4  Temp (24hrs), Avg:99.1 F (37.3 C), Min:98.2 F (36.8 C), Max:100.1 F (37.8 C)  Recent Labs  Lab 05/23/23 1928 05/23/23 1934  WBC 7.6  --   CREATININE 0.86  --   LATICACIDVEN  --  1.3    Estimated Creatinine Clearance: 114.9 mL/min (by C-G formula based on SCr of 0.86 mg/dL).    No Known Allergies  Daylene Posey, PharmD, [facility] Clinical Pharmacist ED Pharmacist Phone # [phone] 05/24/2023 10:44 AM
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
## 2023-05-24 NOTE — ED Provider Notes (Signed)
Care assumed from Trudee Grip, PA-C at shift change pending hand consultation. See her note for full HPI.  In short, patient is a 37 year old male who presents to the ED due to right middle finger pain and edema.  No fever. Physical Exam  BP (!) 125/90   Pulse 69   Temp 99.1 F (37.3 C)   Resp 18   SpO2 100%   Physical Exam Vitals and nursing note reviewed.  Constitutional:      General: He is not in acute distress.    Appearance: He is not ill-appearing.  HENT:     Head: Normocephalic.  Eyes:     Pupils: Pupils are equal, round, and reactive to light.  Cardiovascular:     Rate and Rhythm: Normal rate and regular rhythm.     Pulses: Normal pulses.     Heart sounds: Normal heart sounds. No murmur heard.    No friction rub. No gallop.  Pulmonary:     Effort: Pulmonary effort is normal.     Breath sounds: Normal breath sounds.  Abdominal:     General: Abdomen is flat. There is no distension.     Palpations: Abdomen is soft.     Tenderness: There is no abdominal tenderness. There is no guarding or rebound.  Musculoskeletal:        General: Normal range of motion.     Cervical back: Neck supple.     Comments: TTP throughout right 3rd finger with significant edema.   Skin:    General: Skin is warm and dry.  Neurological:     General: No focal deficit present.     Mental Status: He is alert.  Psychiatric:        Mood and Affect: Mood normal.        Behavior: Behavior normal.     Procedures  Procedures  ED Course / MDM    Medical Decision Making Risk Prescription drug management. Decision regarding hospitalization.   Care assumed from Trudee Grip, PA-C at shift change pending hand consultation due to concerns about possible flexor tenosynovitis.  Labs reassuring.  No leukocytosis.  Normal lactic acid.  Patient was borderline febrile and mildly tachycardic upon arrival to the ED which resolved.  Patient placed on IV antibiotics by previous provider.  8:05 AM  reassessed patient at bedside.  Patient admits to significant pain in right hand.  Awaiting hand surgery consultation.  Patient already given IV antibiotics by previous provider.  Will give another dose of pain medication.  Patient assessed by Earney Hamburg, PA-C with orthopedics.  Patient will have I&D this afternoon (around 5PM) with Dr. Kerry Fort. Recommends medical admission for IV antibiotics. Keep NPO.   9:37 AM Discussed with Dr. Sloan Leiter with internal medicine who agrees to admit patient.      Jesusita Oka 05/24/23 1610    Glendora Score, MD 05/24/23 2027

## 2023-05-25 ENCOUNTER — Encounter (HOSPITAL_COMMUNITY): Payer: Self-pay | Admitting: Orthopedic Surgery

## 2023-05-25 DIAGNOSIS — M65141 Other infective (teno)synovitis, right hand: Secondary | ICD-10-CM | POA: Diagnosis present

## 2023-05-25 DIAGNOSIS — Z1623 Resistance to quinolones and fluoroquinolones: Secondary | ICD-10-CM | POA: Diagnosis present

## 2023-05-25 DIAGNOSIS — A4901 Methicillin susceptible Staphylococcus aureus infection, unspecified site: Secondary | ICD-10-CM | POA: Diagnosis not present

## 2023-05-25 DIAGNOSIS — F17210 Nicotine dependence, cigarettes, uncomplicated: Secondary | ICD-10-CM | POA: Diagnosis present

## 2023-05-25 DIAGNOSIS — Z1629 Resistance to other single specified antibiotic: Secondary | ICD-10-CM | POA: Diagnosis present

## 2023-05-25 DIAGNOSIS — M659 Synovitis and tenosynovitis, unspecified: Secondary | ICD-10-CM | POA: Diagnosis not present

## 2023-05-25 DIAGNOSIS — S61218A Laceration without foreign body of other finger without damage to nail, initial encounter: Secondary | ICD-10-CM | POA: Diagnosis present

## 2023-05-25 DIAGNOSIS — L02511 Cutaneous abscess of right hand: Secondary | ICD-10-CM | POA: Diagnosis not present

## 2023-05-25 DIAGNOSIS — S6991XA Unspecified injury of right wrist, hand and finger(s), initial encounter: Secondary | ICD-10-CM | POA: Diagnosis not present

## 2023-05-25 DIAGNOSIS — F1910 Other psychoactive substance abuse, uncomplicated: Secondary | ICD-10-CM | POA: Diagnosis present

## 2023-05-25 DIAGNOSIS — B9562 Methicillin resistant Staphylococcus aureus infection as the cause of diseases classified elsewhere: Secondary | ICD-10-CM | POA: Diagnosis present

## 2023-05-25 MED ORDER — OXYCODONE HCL 5 MG PO TABS
5.0000 mg | ORAL_TABLET | Freq: Once | ORAL | Status: AC
  Administered 2023-05-25: 5 mg via ORAL
  Filled 2023-05-25: qty 1

## 2023-05-25 MED ORDER — OXYCODONE HCL 5 MG PO TABS
10.0000 mg | ORAL_TABLET | Freq: Four times a day (QID) | ORAL | Status: DC | PRN
  Administered 2023-05-25: 10 mg via ORAL
  Filled 2023-05-25: qty 2

## 2023-05-25 MED ORDER — POLYETHYLENE GLYCOL 3350 17 G PO PACK
17.0000 g | PACK | Freq: Every day | ORAL | Status: DC
  Administered 2023-05-25 – 2023-05-27 (×3): 17 g via ORAL
  Filled 2023-05-25 (×3): qty 1

## 2023-05-25 MED ORDER — ENOXAPARIN SODIUM 40 MG/0.4ML IJ SOSY: 40.0000 mg | PREFILLED_SYRINGE | INTRAMUSCULAR | Status: DC

## 2023-05-25 MED ORDER — ENOXAPARIN SODIUM 40 MG/0.4ML IJ SOSY
40.0000 mg | PREFILLED_SYRINGE | INTRAMUSCULAR | Status: DC
  Administered 2023-05-25 – 2023-05-27 (×3): 40 mg via SUBCUTANEOUS
  Filled 2023-05-25 (×3): qty 0.4

## 2023-05-25 NOTE — Progress Notes (Signed)
Patient ID: Duane King, male   DOB: [birth year], 37 y.o.   MRN: [id number]   LOS: 0 days   Subjective: Doing well, pain controlled   Objective: Vital signs in last 24 hours: Temp:  [96.7 F (35.9 C)-98.4 F (36.9 C)] 98 F (36.7 C) (08/06 0745) Pulse Rate:  [61-82] 82 (08/06 0745) Resp:  [12-18] 18 (08/06 0745) BP: (97-125)/(59-89) 113/71 (08/06 0745) SpO2:  [98 %-100 %] 100 % (08/06 0745) Last BM Date : 05/23/23   Laboratory  CBC Recent Labs    05/23/23 1928 05/25/23 0011  WBC 7.6 7.2  HGB 14.5 12.2*  HCT 45.7 37.6*  PLT 318 244   BMET Recent Labs    05/23/23 1928  NA 139  K 3.7  CL 99  CO2 25  GLUCOSE 101*  BUN 10  CREATININE 0.86  CALCIUM 9.3     Physical Exam General appearance: alert and no distress Right hand -- Dressing intact. Able to move fingers, cap refill <2s, sensation intact   Assessment/Plan: Right long finger infection s/p I&D -- Continue dressing.    Freeman Caldron, PA-C Orthopedic Surgery [phone] 05/25/2023

## 2023-05-25 NOTE — Progress Notes (Signed)
   05/25/23 0951  Spiritual Encounters  Type of Visit Follow up  Care provided to: Pt and family  Referral source Nurse (RN/NT/LPN)  Reason for visit Advance directives  OnCall Visit No  Advance Directives (For Healthcare)  Does Patient Have a Medical Advance Directive? No  Would patient like information on creating a medical advance directive? Yes (Inpatient - patient defers creating a medical advance directive at this time - Information given)   Visited with patient to discuss advance directive. Patient's sister was present at the time. Provided advance directive education. Patient stated that he would like his sister to be his agent. Patient sister took papers to look over with the patient. Stated while they are interested they might have it done elsewhere. Explained that we have witnesses and notary available, sister felt they did not welcome [employer] employees to participate as witnesses. Explained that the witnesses would not be [employer] [facility] employee but volunteer witnesses, however patient and sister felt uncomfortable.  If they should change their minds they will contact the chaplain's office.
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
## 2023-05-25 NOTE — Plan of Care (Signed)
Problem: Education: Goal: Knowledge of General Education information will improve Description: Including pain rating scale, medication(s)/side effects and non-pharmacologic comfort measures Outcome: Progressing Pt understands he was admitted into the hospital for right middle finger pain and swelling.  He has an admitting diagnosis of tenosynovitis of his finger.  Pt is POD 1 for irrigation and debridement of right middle finger on 8/5.    Problem: Clinical Measurements: Goal: Ability to maintain clinical measurements within normal limits will improve Outcome: Progressing Pt's VS WNL thus far.   Problem: Clinical Measurements: Goal: Will remain free from infection Outcome: Progressing S/Sx of infection monitored and assessed q8 hours.  Pt has remained afebrile thus far.     Problem: Clinical Measurements: Goal: Respiratory complications will improve Outcome: Progressing Respiratory status monitored and assessed q8 hours.  Pt is on room air with PO2 saturations at 100% and respirations of 18 breaths per minute.     Problem: Activity: Goal: Risk for activity intolerance will decrease Outcome: Progressing Pt is independent of all his ADLs.  He was observed OOB ambulating in his room with a steady gait..    Problem: Nutrition: Goal: Adequate nutrition will be maintained Outcome: Progressing Pt is on a regular diet per MD's orders.  He has been able to tolerate his diet without s/sx of n/v or abdominal pain/ distention.   Problem: Elimination: Goal: Will not experience complications related to bowel motility Outcome: Progressing Pt LBM was in 05/23/2023.  He has endorsed c/o constipation.    Problem: Elimination: Goal: Will not experience complications related to urinary retention Outcome: Progressing Pt utilizes the urinal to capture his urine.  He has denied c/o dysuria or abdominal distention/ pain.   Problem: Pain Management: Goal: General experience of comfort will  improve Outcome: Progressing Pt has endorsed c/o 8/10 right hand pain describing it as a constant sharp, throbbing, ache. Reiterated pain scale so he could adequately rate his pain. Pt stated his pain goal this admission would be 0/10. Discussed nonpharmacological methods to help reduce s/sx of pain. Interventions given per pt's request and MD's orders.    Problem: Safety: Goal: Ability to remain free from injury will improve Outcome: Progressing Pt has remained free from falls thus far.  Instructed pt to utilize RN call light for assistance.  Hourly rounds performed.  Bed in lowest position, locked with two upper side rails engaged.  Belongings and call light within reach.    Problem: Skin Integrity: Goal: Risk for impaired skin integrity will decrease Outcome: Progressing Skin integrity monitored and assessed q-shift.  Instructed pt to turn/ reposition himself q2 hours to prevent further skin impairment.  Tubes and drains assessed for device related pressure sores.  Pt is continent of both bowel and bladder.

## 2023-05-26 ENCOUNTER — Other Ambulatory Visit (HOSPITAL_COMMUNITY): Payer: Self-pay

## 2023-05-26 DIAGNOSIS — M659 Synovitis and tenosynovitis, unspecified: Secondary | ICD-10-CM

## 2023-05-26 DIAGNOSIS — A4901 Methicillin susceptible Staphylococcus aureus infection, unspecified site: Secondary | ICD-10-CM

## 2023-05-26 MED ORDER — ACETAMINOPHEN 500 MG PO TABS
1000.0000 mg | ORAL_TABLET | Freq: Four times a day (QID) | ORAL | 0 refills | Status: DC
  Filled 2023-05-26: qty 100, 13d supply, fill #0

## 2023-05-26 MED ORDER — OXYCODONE HCL 10 MG PO TABS
10.0000 mg | ORAL_TABLET | Freq: Four times a day (QID) | ORAL | 0 refills | Status: DC | PRN
  Filled 2023-05-26: qty 10, 3d supply, fill #0

## 2023-05-26 MED ORDER — IBUPROFEN 800 MG PO TABS
800.0000 mg | ORAL_TABLET | Freq: Three times a day (TID) | ORAL | 0 refills | Status: DC
Start: 2023-05-26 — End: 2023-06-19
  Filled 2023-05-26: qty 30, 10d supply, fill #0

## 2023-05-26 MED ORDER — DOXYCYCLINE HYCLATE 100 MG PO TABS
100.0000 mg | ORAL_TABLET | Freq: Two times a day (BID) | ORAL | 0 refills | Status: DC
Start: 2023-05-26 — End: 2023-05-27
  Filled 2023-05-26 – 2023-05-27 (×2): qty 14, 7d supply, fill #0

## 2023-05-26 MED ORDER — CHLORHEXIDINE GLUCONATE 4 % EX SOLN
Freq: Four times a day (QID) | CUTANEOUS | Status: DC
  Filled 2023-05-26 (×5): qty 15
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
## 2023-05-26 NOTE — Progress Notes (Signed)
HD#1 Subjective:  Overnight Events: No acute events overnight.  Patient was seen at the bedside. Reporting mild pain in the right middle fingers. He is scheduled for finger dressing and wrapping with Ortho.   Objective:  Vital signs in last 24 hours: Vitals:   05/25/23 1605 05/25/23 1919 05/26/23 0348 05/26/23 0700  BP: 101/64 111/60 116/69 113/68  Pulse: 84 84 85 70  Resp: 16 18 18 18   Temp: 98 F (36.7 C) 98.6 F (37 C) 98.1 F (36.7 C) 98.2 F (36.8 C)  TempSrc: Oral Oral  Oral  SpO2: 99% 100% 99%   Weight:      Height:       Supplemental O2: Room Air SpO2: 99 % O2 Flow Rate (L/min): 3 L/min   Physical Exam:   CV: RRR. No m/r/g. No LE edema Right Hand: Dressing intact.  Able to move the right fingers, normal capillary refill.  Filed Weights   05/24/23 1021  Weight: 80 kg     Intake/Output Summary (Last 24 hours) at 05/26/2023 1029 Last data filed at 05/26/2023 0428 Gross per 24 hour  Intake 1780 ml  Output --  Net 1780 ml   Net IO Since Admission: 3,690.59 mL [05/26/23 1029]  No results for input(s): "GLUCAP" in the last 72 hours.   Pertinent Labs:    Latest Ref Rng & Units 05/26/2023    1:54 AM 05/25/2023   12:11 AM 05/23/2023    7:28 PM  CBC  WBC 4.0 - 10.5 K/uL 4.2  7.2  7.6   Hemoglobin 13.0 - 17.0 g/dL 16.1  09.6  04.5   Hematocrit 39.0 - 52.0 % 34.7  37.6  45.7   Platelets 150 - 400 K/uL 243  244  318        Latest Ref Rng & Units 05/26/2023    1:54 AM 05/23/2023    7:28 PM  CMP  Glucose 70 - 99 mg/dL 409  811   BUN 6 - 20 mg/dL 10  10   Creatinine 9.14 - 1.24 mg/dL 7.82  9.56   Sodium 213 - 145 mmol/L 138  139   Potassium 3.5 - 5.1 mmol/L 3.6  3.7   Chloride 98 - 111 mmol/L 102  99   CO2 22 - 32 mmol/L 25  25   Calcium 8.9 - 10.3 mg/dL 8.3  9.3     Imaging: No results found.  Assessment/Plan:   Principal Problem:   Tenosynovitis of finger Active Problems:   Injury of right hand   Patient Summary: Duane King is a 37 y.o.  male with a history of polysubstance use disorder who presented with right hand pain and admitted for infective tenosynovitis of 3rd finger of right hand, S/p irrigation and debridement of the right third finger.  Right long finger infection  He is S/p irrigation  and debridement of the third finger on 8/5. He is afebrile, no leukocytosis on labs. Gram stain of IntraOp culture growing Staph aureus.  Will follow-up with the susceptibilities.  MRSA negative, we will discontinue metronidazole and ceftriaxone, continue IV vancomycin.   - CBC, BMP. - Continue IV vancomycin - Discontinue ceftriaxone, and metronidazole. - We will transition to oral doxycycline 100 mg for 7 days on 8/8-8/15 - Multimodal pain control with Tylenol, ibuprofen and Oxy 10 mg as needed.   Diet: Regular diet IVF: PO intake VTE: enoxaparin (LOVENOX) injection 40 mg Start: 05/25/23 1000 SCDs Start: 05/24/23 0865 Code: Full PT/OT:  Home ID:  Anti-infectives (From admission, onward)    Start     Dose/Rate Route Frequency Ordered Stop   05/25/23 0600  ceFAZolin (ANCEF) IVPB 2g/100 mL premix        2 g 200 mL/hr over 30 Minutes Intravenous On call to O.R. 05/24/23 1545 05/24/23 1824   05/25/23 0000  cefTRIAXone (ROCEPHIN) 2 g in sodium chloride 0.9 % 100 mL IVPB  Status:  Discontinued        2 g 200 mL/hr over 30 Minutes Intravenous Every 24 hours 05/24/23 1003 05/26/23 0941   05/24/23 1400  vancomycin (VANCOCIN) IVPB 1000 mg/200 mL premix        1,000 mg 200 mL/hr over 60 Minutes Intravenous Every 8 hours 05/24/23 1045     05/24/23 1015  metroNIDAZOLE (FLAGYL) IVPB 500 mg  Status:  Discontinued        500 mg 100 mL/hr over 60 Minutes Intravenous Every 8 hours 05/24/23 1003 05/26/23 0939   05/24/23 0500  vancomycin (VANCOREADY) IVPB 1500 mg/300 mL        1,500 mg 150 mL/hr over 120 Minutes Intravenous  Once 05/24/23 0447 05/24/23 0727   05/24/23 0445  cefTRIAXone (ROCEPHIN) 2 g in sodium chloride 0.9 % 100 mL IVPB         2 g 200 mL/hr over 30 Minutes Intravenous  Once 05/24/23 0442 05/24/23 0527        Anticipated discharge to Home in 2 days days pending show culture results Laretta Bolster, M.D.  Internal Medicine Resident, PGY-1 [facility]  Pager: [phone] 10:29 AM, 05/26/2023   **Please contact the on call pager after 5 pm and on weekends at [phone].**

## 2023-05-26 NOTE — Progress Notes (Signed)
Patient ID: Duane King, male   DOB: 1986/07/21, 36 y.o.   MRN: [id number]   LOS: 1 day   Subjective: Doing well, pain improved   Objective: Vital signs in last 24 hours: Temp:  [98 F (36.7 C)-98.6 F (37 C)] 98.2 F (36.8 C) (08/07 0700) Pulse Rate:  [70-85] 70 (08/07 0700) Resp:  [16-18] 18 (08/07 0700) BP: (101-116)/(60-69) 113/68 (08/07 0700) SpO2:  [99 %-100 %] 99 % (08/07 0348) Last BM Date : 05/25/23   Laboratory  CBC Recent Labs    05/25/23 0011 05/26/23 0154  WBC 7.2 4.2  HGB 12.2* 11.4*  HCT 37.6* 34.7*  PLT 244 243   BMET Recent Labs    05/23/23 1928 05/26/23 0154  NA 139 138  K 3.7 3.6  CL 99 102  CO2 25 25  GLUCOSE 101* 108*  BUN 10 10  CREATININE 0.86 0.86  CALCIUM 9.3 8.3*     Physical Exam General appearance: alert and no distress Right hand -- Dressing and packing removed. Incisions C/D/I. No active discharge from wound.   Assessment/Plan: S/p I&D right hand -- Start abx soaks qid. May discharge tomorrow if tolerates.    Freeman Caldron, PA-C Orthopedic Surgery [phone] 05/26/2023
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
## 2023-05-26 NOTE — Discharge Instructions (Addendum)
It was a pleasure taking care of you!  You were admitted due to right middle finger infection. You had surgery on your finger to clear out the infection. -Continue with oral doxycycline 100 mg twice a day for 7 days. -Call Dr. Virgina Jock (surgeon) Clinic to schedule an appt for next week.  Phone number: [phone].  Pain Continue to take 2 tablets of the acetaminophen 500 mg every 6 hours pain. Also continue to take ibuprofen 800 mg every 8 hours for pain. Take 1 tablet of Oxycodone 10 mg for moderate to severe pain.    You also have an appointment with [facility] on August 15th at 8:45 am

## 2023-05-26 NOTE — Plan of Care (Signed)

## 2023-05-27 ENCOUNTER — Other Ambulatory Visit (HOSPITAL_COMMUNITY): Payer: Self-pay

## 2023-05-27 DIAGNOSIS — A4901 Methicillin susceptible Staphylococcus aureus infection, unspecified site: Secondary | ICD-10-CM

## 2023-05-27 MED ORDER — DOXYCYCLINE HYCLATE 100 MG PO TABS
100.0000 mg | ORAL_TABLET | Freq: Two times a day (BID) | ORAL | 0 refills | Status: AC
Start: 2023-05-27 — End: 2023-06-10
  Filled 2023-05-27: qty 28, 14d supply, fill #0

## 2023-05-27 NOTE — TOC Transition Note (Signed)
Transition of Care [facility]) - CM/SW Discharge Note   Patient Details  Name: Duane King MRN: [id number] Date of Birth: January 30, 1986  [facility] CM/SW Contact:  Harriet Masson, RN Phone Number: 05/27/2023, 10:08 AM   Clinical Narrative:    Patient stable for discharge.  Patient's sister will transport home.  PCP apt on AVS. TOC pharmacy messaged [facility] letter information.     Final next level of care: Home/Self Care Barriers to Discharge: Barriers Resolved   Patient Goals and CMS Choice    Return home  Discharge Placement  home                       Discharge Plan and Services Additional resources added to the After Visit Summary for     Discharge Planning Services: [facility] Program, [facility]                                 Social Determinants of Health (SDOH) Interventions SDOH Screenings   Food Insecurity: Food Insecurity Present (05/24/2023)  Housing: Medium Risk (05/24/2023)  Transportation Needs: No Transportation Needs (05/24/2023)  Utilities: Not At Risk (05/24/2023)  Tobacco Use: High Risk (05/24/2023)     Readmission Risk Interventions    05/27/2023   10:08 AM  Readmission Risk Prevention Plan  Post Dischage Appt Complete  Medication Screening Complete  Transportation Screening Complete

## 2023-05-27 NOTE — Progress Notes (Signed)
Subjective: 3 Days Post-Op Procedure(s) (LRB): IRRIGATION AND DEBRIDEMENT RIGHT LONG FINGER (Right) Patient reports pain as well controlled. Cultures positive for S. Aureus, sensitivities pending.    Objective: Vital signs in last 24 hours: Temp:  [98 F (36.7 C)-98.9 F (37.2 C)] 98 F (36.7 C) (08/08 0738) Pulse Rate:  [55-81] 59 (08/08 0738) Resp:  [18] 18 (08/08 0738) BP: (99-110)/(54-67) 99/54 (08/08 0738) SpO2:  [99 %-100 %] 100 % (08/08 0738)  Intake/Output from previous day: No intake/output data recorded. Intake/Output this shift: No intake/output data recorded.  Recent Labs    05/25/23 0011 05/26/23 0154 05/27/23 0114  HGB 12.2* 11.4* 11.8*   Recent Labs    05/26/23 0154 05/27/23 0114  WBC 4.2 3.5*  RBC 4.15* 4.35  HCT 34.7* 36.4*  PLT 243 274   Recent Labs    05/26/23 0154  NA 138  K 3.6  CL 102  CO2 25  BUN 10  CREATININE 0.86  GLUCOSE 108*  CALCIUM 8.3*   No results for input(s): "LABPT", "INR" in the last 72 hours.  Sensation intact on radial and ulnar aspects of distal tip of finger.  Central portion of incision open with no evidence of infection.  Able to wiggle finger.  Assessment/Plan: 3 Days Post-Op Procedure(s) (LRB): IRRIGATION AND DEBRIDEMENT RIGHT LONG FINGER (Right)  Patient will need to continue PO antibiotics for 2 weeks.  He will need to continue antibacterial soap soaks TID for 15 minutes each after discharge.  Instructions were also placed in the discharge instructions.  He should keep the wound covered with a Telfa non-adhesive gauze and either ACE, coban, or tape.  He was also instructed on performing finger ROM exercises to prevent stiffness.  Follow up in my clinic next week.  Scheduling number is [phone].   Edsel Petrin  05/27/2023, 10:29 AM

## 2023-05-27 NOTE — Progress Notes (Signed)
Explained discharge instructions to patient. Reviewed follow up appointment and next medication administration times. Also reviewed education. Patient verbalized having an understanding for instructions given. All belongings are in the patient's possession to include TOC meds. IV was removed. No other needs verbalized. Transported downstairs for discharge.

## 2023-06-04 ENCOUNTER — Ambulatory Visit: Payer: Self-pay | Admitting: Student

## 2023-06-18 ENCOUNTER — Other Ambulatory Visit: Payer: Self-pay

## 2023-06-18 ENCOUNTER — Inpatient Hospital Stay (HOSPITAL_COMMUNITY)
Admission: EM | Admit: 2023-06-18 | Discharge: 2023-06-22 | DRG: 514 | Disposition: A | Discharge status: Home / Self Care | Payer: Medicaid Other | Attending: Internal Medicine | Admitting: Internal Medicine

## 2023-06-18 ENCOUNTER — Encounter (HOSPITAL_COMMUNITY): Payer: Self-pay

## 2023-06-18 DIAGNOSIS — Z8614 Personal history of Methicillin resistant Staphylococcus aureus infection: Secondary | ICD-10-CM

## 2023-06-18 DIAGNOSIS — M65141 Other infective (teno)synovitis, right hand: Principal | ICD-10-CM | POA: Diagnosis present

## 2023-06-18 DIAGNOSIS — M659 Synovitis and tenosynovitis, unspecified: Secondary | ICD-10-CM | POA: Diagnosis present

## 2023-06-18 DIAGNOSIS — F1910 Other psychoactive substance abuse, uncomplicated: Secondary | ICD-10-CM | POA: Insufficient documentation

## 2023-06-18 DIAGNOSIS — F17210 Nicotine dependence, cigarettes, uncomplicated: Secondary | ICD-10-CM | POA: Diagnosis present

## 2023-06-18 NOTE — ED Triage Notes (Signed)
Pt. BIB [facility] for a hand problem. Pt. Was had surgery about 2 months ago and states that he believes his staph infection is back. Pt. Endorses pain. Denies injury to the hand. Hand is swollen and warm to touch.

## 2023-06-19 ENCOUNTER — Encounter (HOSPITAL_COMMUNITY)
Admission: EM | Disposition: A | Discharge status: Home / Self Care | Payer: Self-pay | Source: Home / Self Care | Attending: Internal Medicine

## 2023-06-19 ENCOUNTER — Emergency Department (HOSPITAL_COMMUNITY): Payer: Medicaid Other

## 2023-06-19 ENCOUNTER — Encounter (HOSPITAL_COMMUNITY): Payer: Self-pay | Admitting: Internal Medicine

## 2023-06-19 ENCOUNTER — Observation Stay (HOSPITAL_COMMUNITY): Payer: Medicaid Other | Admitting: Certified Registered Nurse Anesthetist

## 2023-06-19 DIAGNOSIS — F1910 Other psychoactive substance abuse, uncomplicated: Secondary | ICD-10-CM | POA: Diagnosis not present

## 2023-06-19 DIAGNOSIS — M65841 Other synovitis and tenosynovitis, right hand: Secondary | ICD-10-CM | POA: Diagnosis not present

## 2023-06-19 DIAGNOSIS — M659 Synovitis and tenosynovitis, unspecified: Secondary | ICD-10-CM | POA: Diagnosis not present

## 2023-06-19 DIAGNOSIS — L089 Local infection of the skin and subcutaneous tissue, unspecified: Secondary | ICD-10-CM

## 2023-06-19 DIAGNOSIS — M79641 Pain in right hand: Secondary | ICD-10-CM | POA: Diagnosis present

## 2023-06-19 DIAGNOSIS — M65141 Other infective (teno)synovitis, right hand: Secondary | ICD-10-CM | POA: Diagnosis not present

## 2023-06-19 DIAGNOSIS — F17210 Nicotine dependence, cigarettes, uncomplicated: Secondary | ICD-10-CM | POA: Diagnosis not present

## 2023-06-19 DIAGNOSIS — Z8614 Personal history of Methicillin resistant Staphylococcus aureus infection: Secondary | ICD-10-CM | POA: Diagnosis not present

## 2023-06-19 HISTORY — PX: INCISION AND DRAINAGE OF WOUND: SHX1803

## 2023-06-19 LAB — CBC WITH DIFFERENTIAL/PLATELET
Abs Immature Granulocytes: 0.01 10*3/uL (ref 0.00–0.07)
Basophils Absolute: 0 10*3/uL (ref 0.0–0.1)
Basophils Relative: 0 %
Eosinophils Absolute: 0 10*3/uL (ref 0.0–0.5)
Eosinophils Relative: 0 %
HCT: 37.5 % — ABNORMAL LOW (ref 39.0–52.0)
Hemoglobin: 12.1 g/dL — ABNORMAL LOW (ref 13.0–17.0)
Immature Granulocytes: 0 %
Lymphocytes Relative: 13 %
Lymphs Abs: 0.6 10*3/uL — ABNORMAL LOW (ref 0.7–4.0)
MCH: 27.1 pg (ref 26.0–34.0)
MCHC: 32.3 g/dL (ref 30.0–36.0)
MCV: 84.1 fL (ref 80.0–100.0)
Monocytes Absolute: 0.5 10*3/uL (ref 0.1–1.0)
Monocytes Relative: 11 %
Neutro Abs: 3.4 10*3/uL (ref 1.7–7.7)
Neutrophils Relative %: 76 %
Platelets: 170 10*3/uL (ref 150–400)
RBC: 4.46 MIL/uL (ref 4.22–5.81)
RDW: 11.4 % — ABNORMAL LOW (ref 11.5–15.5)
WBC: 4.5 10*3/uL (ref 4.0–10.5)
nRBC: 0 % (ref 0.0–0.2)

## 2023-06-19 LAB — BASIC METABOLIC PANEL
Anion gap: 13 (ref 5–15)
BUN: 10 mg/dL (ref 6–20)
CO2: 25 mmol/L (ref 22–32)
Calcium: 8.6 mg/dL — ABNORMAL LOW (ref 8.9–10.3)
Chloride: 95 mmol/L — ABNORMAL LOW (ref 98–111)
Creatinine, Ser: 0.76 mg/dL (ref 0.61–1.24)
GFR, Estimated: >60 mL/min (ref >60)
Glucose, Bld: 100 mg/dL — ABNORMAL HIGH (ref 70–99)
Potassium: 3.5 mmol/L (ref 3.5–5.1)
Sodium: 133 mmol/L — ABNORMAL LOW (ref 135–145)

## 2023-06-19 LAB — TYPE AND SCREEN
ABO/RH(D): B POS
Antibody Screen: NEGATIVE

## 2023-06-19 LAB — PROTIME-INR
INR: 0.9 (ref 0.8–1.2)
Prothrombin Time: 12.6 seconds (ref 11.4–15.2)

## 2023-06-19 LAB — I-STAT CG4 LACTIC ACID, ED: Lactic Acid, Venous: 2.1 mmol/L (ref 0.5–1.9)

## 2023-06-19 LAB — ABO/RH: ABO/RH(D): B POS

## 2023-06-19 SURGERY — IRRIGATION AND DEBRIDEMENT WOUND
Anesthesia: General | Laterality: Right

## 2023-06-19 MED ORDER — ONDANSETRON HCL 4 MG/2ML IJ SOLN: 4.0000 mg | Freq: Four times a day (QID) | INTRAMUSCULAR | Status: DC | PRN

## 2023-06-19 MED ORDER — VANCOMYCIN HCL 1500 MG/300ML IV SOLN
1500.0000 mg | Freq: Two times a day (BID) | INTRAVENOUS | Status: DC
  Administered 2023-06-19 – 2023-06-22 (×6): 1500 mg via INTRAVENOUS
  Filled 2023-06-19 (×7): qty 300

## 2023-06-19 MED ORDER — CHLORHEXIDINE GLUCONATE 0.12 % MT SOLN
15.0000 mL | Freq: Once | OROMUCOSAL | Status: AC
  Administered 2023-06-19: 15 mL via OROMUCOSAL

## 2023-06-19 MED ORDER — HYDROMORPHONE HCL 1 MG/ML IJ SOLN
1.0000 mg | INTRAMUSCULAR | Status: DC | PRN
  Administered 2023-06-19: 1 mg via INTRAVENOUS
  Filled 2023-06-19: qty 1

## 2023-06-19 MED ORDER — SODIUM CHLORIDE 0.9 % IV BOLUS
1000.0000 mL | Freq: Once | INTRAVENOUS | Status: AC
  Administered 2023-06-19: 1000 mL via INTRAVENOUS

## 2023-06-19 MED ORDER — BUPIVACAINE HCL (PF) 0.5 % IJ SOLN
INTRAMUSCULAR | Status: DC | PRN
Start: 2023-06-19 — End: 2023-06-19
  Administered 2023-06-19: 10 mL

## 2023-06-19 MED ORDER — SODIUM CHLORIDE 0.9 % IV SOLN
INTRAVENOUS | Status: AC
  Filled 2023-06-19 (×3): qty 1000

## 2023-06-19 MED ORDER — HEPARIN SODIUM (PORCINE) 5000 UNIT/ML IJ SOLN
5000.0000 [IU] | Freq: Three times a day (TID) | INTRAMUSCULAR | Status: DC
  Administered 2023-06-19 – 2023-06-22 (×6): 5000 [IU] via SUBCUTANEOUS
  Filled 2023-06-19 (×6): qty 1

## 2023-06-19 MED ORDER — ONDANSETRON HCL 4 MG/2ML IJ SOLN
INTRAMUSCULAR | Status: DC | PRN
  Administered 2023-06-19: 4 mg via INTRAVENOUS

## 2023-06-19 MED ORDER — LIDOCAINE 2% (20 MG/ML) 5 ML SYRINGE
INTRAMUSCULAR | Status: DC | PRN
  Administered 2023-06-19: 40 mg via INTRAVENOUS

## 2023-06-19 MED ORDER — HYDROMORPHONE HCL 1 MG/ML IJ SOLN
1.0000 mg | Freq: Once | INTRAMUSCULAR | Status: AC
  Administered 2023-06-19: 1 mg via INTRAVENOUS
  Filled 2023-06-19: qty 1

## 2023-06-19 MED ORDER — DOCUSATE SODIUM 100 MG PO CAPS
100.0000 mg | ORAL_CAPSULE | Freq: Two times a day (BID) | ORAL | Status: DC
  Administered 2023-06-19 – 2023-06-21 (×5): 100 mg via ORAL
  Filled 2023-06-19 (×6): qty 1

## 2023-06-19 MED ORDER — LACTATED RINGERS IV SOLN: INTRAVENOUS | Status: DC

## 2023-06-19 MED ORDER — MIDAZOLAM HCL 2 MG/2ML IJ SOLN
INTRAMUSCULAR | Status: DC | PRN
  Administered 2023-06-19: 2 mg via INTRAVENOUS

## 2023-06-19 MED ORDER — FENTANYL CITRATE (PF) 250 MCG/5ML IJ SOLN
INTRAMUSCULAR | Status: AC
  Filled 2023-06-19: qty 5

## 2023-06-19 MED ORDER — MIDAZOLAM HCL 2 MG/2ML IJ SOLN
INTRAMUSCULAR | Status: AC
  Filled 2023-06-19: qty 2

## 2023-06-19 MED ORDER — ACETAMINOPHEN 650 MG RE SUPP: 650.0000 mg | Freq: Four times a day (QID) | RECTAL | Status: DC

## 2023-06-19 MED ORDER — 0.9 % SODIUM CHLORIDE (POUR BTL) OPTIME
TOPICAL | Status: DC | PRN
Start: 2023-06-19 — End: 2023-06-19
  Administered 2023-06-19: 1000 mL

## 2023-06-19 MED ORDER — ACETAMINOPHEN 500 MG PO TABS
ORAL_TABLET | ORAL | Status: AC
  Filled 2023-06-19: qty 2

## 2023-06-19 MED ORDER — FENTANYL CITRATE (PF) 250 MCG/5ML IJ SOLN
INTRAMUSCULAR | Status: DC | PRN
  Administered 2023-06-19: 50 ug via INTRAVENOUS

## 2023-06-19 MED ORDER — ONDANSETRON HCL 4 MG PO TABS: 4.0000 mg | ORAL_TABLET | Freq: Four times a day (QID) | ORAL | Status: DC | PRN

## 2023-06-19 MED ORDER — ACETAMINOPHEN 500 MG PO TABS
1000.0000 mg | ORAL_TABLET | Freq: Four times a day (QID) | ORAL | Status: DC
  Administered 2023-06-19 – 2023-06-20 (×5): 1000 mg via ORAL
  Filled 2023-06-19 (×5): qty 2

## 2023-06-19 MED ORDER — POLYETHYLENE GLYCOL 3350 17 G PO PACK: 17.0000 g | PACK | Freq: Every day | ORAL | Status: DC | PRN

## 2023-06-19 MED ORDER — SODIUM CHLORIDE 0.9 % IV SOLN: INTRAVENOUS | Status: DC

## 2023-06-19 MED ORDER — SODIUM CHLORIDE 0.9 % IR SOLN
Status: DC | PRN
Start: 2023-06-19 — End: 2023-06-19
  Administered 2023-06-19: 1000 mL

## 2023-06-19 MED ORDER — OXYCODONE HCL 5 MG PO TABS
10.0000 mg | ORAL_TABLET | ORAL | Status: DC | PRN
  Administered 2023-06-19 – 2023-06-22 (×4): 10 mg via ORAL
  Filled 2023-06-19 (×4): qty 2

## 2023-06-19 MED ORDER — PROPOFOL 500 MG/50ML IV EMUL
INTRAVENOUS | Status: DC | PRN
  Administered 2023-06-19: 150 ug/kg/min via INTRAVENOUS

## 2023-06-19 MED ORDER — VANCOMYCIN HCL 1500 MG/300ML IV SOLN
1500.0000 mg | Freq: Once | INTRAVENOUS | Status: AC
  Administered 2023-06-19: 1500 mg via INTRAVENOUS
  Filled 2023-06-19: qty 300

## 2023-06-19 MED ORDER — ACETAMINOPHEN 325 MG PO TABS
650.0000 mg | ORAL_TABLET | Freq: Four times a day (QID) | ORAL | Status: DC | PRN
  Administered 2023-06-19: 650 mg via ORAL
  Filled 2023-06-19: qty 2

## 2023-06-19 MED ORDER — PROPOFOL 10 MG/ML IV BOLUS
INTRAVENOUS | Status: DC | PRN
  Administered 2023-06-19: 20 mg via INTRAVENOUS

## 2023-06-19 MED ORDER — ORAL CARE MOUTH RINSE: 15.0000 mL | Freq: Once | OROMUCOSAL | Status: AC

## 2023-06-19 MED ORDER — BUPIVACAINE HCL (PF) 0.5 % IJ SOLN
INTRAMUSCULAR | Status: AC
  Filled 2023-06-19: qty 10

## 2023-06-19 MED ORDER — HYDROMORPHONE HCL 1 MG/ML IJ SOLN
1.0000 mg | INTRAMUSCULAR | Status: DC | PRN
  Administered 2023-06-20 – 2023-06-21 (×2): 1 mg via INTRAVENOUS
  Filled 2023-06-19 (×2): qty 1

## 2023-06-19 MED ORDER — ONDANSETRON HCL 4 MG/2ML IJ SOLN
4.0000 mg | Freq: Once | INTRAMUSCULAR | Status: AC
  Administered 2023-06-19: 4 mg via INTRAVENOUS
  Filled 2023-06-19: qty 2

## 2023-06-19 MED ORDER — ACETAMINOPHEN 325 MG PO TABS
ORAL_TABLET | ORAL | Status: AC
  Filled 2023-06-19: qty 2

## 2023-06-19 MED ORDER — OXYCODONE HCL 5 MG PO TABS
5.0000 mg | ORAL_TABLET | ORAL | Status: DC | PRN
  Administered 2023-06-19: 5 mg via ORAL
  Filled 2023-06-19 (×2): qty 1

## 2023-06-19 MED ORDER — ACETAMINOPHEN 650 MG RE SUPP: 650.0000 mg | Freq: Four times a day (QID) | RECTAL | Status: DC | PRN

## 2023-06-19 SURGICAL SUPPLY — 45 items
BAG COUNTER SPONGE SURGICOUNT (BAG) ×1 IMPLANT
BAG DECANTER FOR FLEXI CONT (MISCELLANEOUS) ×1 IMPLANT
BAG SPNG CNTER NS LX DISP (BAG) ×1
BNDG CMPR 5X3 CHSV STRCH STRL (GAUZE/BANDAGES/DRESSINGS) ×1
BNDG CMPR 5X3 KNIT ELC UNQ LF (GAUZE/BANDAGES/DRESSINGS)
BNDG COHESIVE 3X5 TAN ST LF (GAUZE/BANDAGES/DRESSINGS) IMPLANT
BNDG ELASTIC 3INX 5YD STR LF (GAUZE/BANDAGES/DRESSINGS) IMPLANT
BNDG ELASTIC 4X5.8 VLCR STR LF (GAUZE/BANDAGES/DRESSINGS) IMPLANT
BNDG GAUZE DERMACEA FLUFF 4 (GAUZE/BANDAGES/DRESSINGS) IMPLANT
BNDG GZE DERMACEA 4 6PLY (GAUZE/BANDAGES/DRESSINGS) ×1
CORD BIPOLAR FORCEPS 12FT (ELECTRODE) IMPLANT
CUFF TOURN SGL QUICK 18X4 (TOURNIQUET CUFF) IMPLANT
DRAPE SURG 17X23 STRL (DRAPES) ×1 IMPLANT
ELECT REM PT RETURN 9FT ADLT (ELECTROSURGICAL)
ELECTRODE REM PT RTRN 9FT ADLT (ELECTROSURGICAL) IMPLANT
GAUZE PACKING IODOFORM 1/4X15 (PACKING) IMPLANT
GAUZE SPONGE 4X4 12PLY STRL (GAUZE/BANDAGES/DRESSINGS) ×1 IMPLANT
GAUZE XEROFORM 1X8 LF (GAUZE/BANDAGES/DRESSINGS) ×1 IMPLANT
GLOVE BIOGEL M 8.0 STRL (GLOVE) ×1 IMPLANT
GOWN STRL REUS W/ TWL LRG LVL3 (GOWN DISPOSABLE) ×2 IMPLANT
GOWN STRL REUS W/TWL LRG LVL3 (GOWN DISPOSABLE) ×2
HANDPIECE INTERPULSE COAX TIP (DISPOSABLE)
IV NS IRRIG 3000ML ARTHROMATIC (IV SOLUTION) IMPLANT
KIT BASIN OR (CUSTOM PROCEDURE TRAY) ×1 IMPLANT
KIT TURNOVER KIT B (KITS) ×1 IMPLANT
MANIFOLD NEPTUNE II (INSTRUMENTS) ×1 IMPLANT
NDL HYPO 25GX1X1/2 BEV (NEEDLE) IMPLANT
NEEDLE HYPO 25GX1X1/2 BEV (NEEDLE) IMPLANT
NS IRRIG 1000ML POUR BTL (IV SOLUTION) ×1 IMPLANT
PACK ORTHO EXTREMITY (CUSTOM PROCEDURE TRAY) ×1 IMPLANT
PAD ARMBOARD 7.5X6 YLW CONV (MISCELLANEOUS) ×2 IMPLANT
PAD CAST 4YDX4 CTTN HI CHSV (CAST SUPPLIES) IMPLANT
PADDING CAST COTTON 4X4 STRL (CAST SUPPLIES)
SET CYSTO W/LG BORE CLAMP LF (SET/KITS/TRAYS/PACK) IMPLANT
SET HNDPC FAN SPRY TIP SCT (DISPOSABLE) IMPLANT
SOAP 2 % CHG 4 OZ (WOUND CARE) ×1 IMPLANT
SPONGE T-LAP 18X18 ~~LOC~~+RFID (SPONGE) IMPLANT
SPONGE T-LAP 4X18 ~~LOC~~+RFID (SPONGE) ×1 IMPLANT
SWAB CULTURE ESWAB REG 1ML (MISCELLANEOUS) IMPLANT
SYR CONTROL 10ML LL (SYRINGE) IMPLANT
TOWEL GREEN STERILE (TOWEL DISPOSABLE) ×1 IMPLANT
TOWEL GREEN STERILE FF (TOWEL DISPOSABLE) ×1 IMPLANT
TUBE CONNECTING 12X1/4 (SUCTIONS) ×1 IMPLANT
WATER STERILE IRR 1000ML POUR (IV SOLUTION) ×1 IMPLANT
YANKAUER SUCT BULB TIP NO VENT (SUCTIONS) ×1 IMPLANT

## 2023-06-19 NOTE — ED Notes (Signed)
Called and gave report to S.RN at [facility]

## 2023-06-19 NOTE — Plan of Care (Signed)
  Problem: Education: Goal: Knowledge of General Education information will improve Description Including pain rating scale, medication(s)/side effects and non-pharmacologic comfort measures Outcome: Progressing   

## 2023-06-19 NOTE — Assessment & Plan Note (Signed)
Admit to Duane King. Culture from prior I&D this month grew out MRSA. Will continue with IV vanco. Keep NPO. Dr. Izora Ribas with hand surgery notified by EDP. Prn oxycodone for pain. UDS pending. Pt still has original suture from 05-24-2023 I&D still present.

## 2023-06-19 NOTE — Progress Notes (Signed)
Received note from Dr. Carollee Herter that Duane King a 37 yo person, recently discharged from IMTS service with doxycycline therapy for MRSA tenosynovitis of the right middle finger s/p I&D, was admitted to [facility] hospitalist service. Patient did not follow with doctor Chiaramonti in hand surgery clinic and presented to ED with worsening pain, edema, and drainage from right middle finger. He is now on hand surgery consulted by ED provider; patient is on IV vancomycin therapy and NPO.  IMTS team will assume care of this patient at 7 AM on 06/19/2023. Lane team resident team will be notified and are to follow up with Hand surgery for potential surgical intervention. Attending will be Dr. Earl Lagos.  Morene Crocker, MD [facility] - PGY-2 06/19/2023, 2:51 AM Please contact the on call pager after 5 pm and on weekends at [phone].

## 2023-06-19 NOTE — Assessment & Plan Note (Signed)
Check UDS. Pt admits to using cocaine last week. Avoid betablockers for now.

## 2023-06-19 NOTE — Subjective & Objective (Signed)
CC: right hand swelling HPI: 37 year old African male with no significant medical history presents to the ER today with continued hand swelling of his right middle finger.  He states that hand started swelling this week.  He had been admitted earlier this month and had surgery on his right middle finger with an I&D on 05/24/2023 for tenosynovitis.  His cultures ultimately grew out MRSA.  He was discharged to home on doxycycline.  He states he finished taking the doxycycline.  Patient never followed up with hand surgery as arranged.  He states that he could not pay for his office visit and therefore did not go.  He states he has been washing his hand and antibacterial soap.  Hand started draining fluid today.  On arrival temp 98.1 heart rate 89 blood pressure 137/87 satting high percent on room air.  White count 4.5, hemoglobin 12.1, platelets of 170  Sodium 133, potassium 3.5, chloride 95, BUN of 10, creatinine 0.76, glucose of 100  Right hand x-rays demonstrate soft tissue swelling of the third finger without evidence of osteomyelitis.  EDP is discussed the case with Dr. Izora Ribas with hand surgery.  [facility] hospitalist consulted.  Of note, the patient was just discharged off the internal medicine teaching service.  I have contacted the senior resident on-call and notified them the patient will need to be changed over to the internal medicine service at 7 AM on 06/19/2023.

## 2023-06-19 NOTE — Transfer of Care (Signed)
Immediate Anesthesia Transfer of Care Note  Patient: Duane King  Procedure(s) Performed: IRRIGATION AND DEBRIDEMENT OF LONG FINGER (Right)  Patient Location: PACU  Anesthesia Type:MAC  Level of Consciousness: drowsy and patient cooperative  Airway & Oxygen Therapy: Patient Spontanous Breathing and Patient connected to face mask oxygen  Post-op Assessment: Report given to RN and Post -op Vital signs reviewed and stable  Post vital signs: Reviewed and stable  Last Vitals:  Vitals Value Taken Time  BP 145/107 06/19/23 1254  Temp    Pulse    Resp 14 06/19/23 1255  SpO2    Vitals shown include unfiled device data.  Last Pain:  Vitals:   06/19/23 1200  TempSrc: Oral  PainSc:          Complications: No notable events documented.
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
## 2023-06-19 NOTE — Progress Notes (Signed)
                 Interval history Hand is very painful this morning.  Reports good appetite, he is hungry.  Physical exam Blood pressure 122/73, pulse 93, temperature 100.1 F (37.8 C), resp. rate 16, height 5\' 8"  (1.727 m), weight 80 kg, SpO2 99%.  No apparent distress Heart rate is normal, rhythm is regular, radial pulses are strong Breathing is regular and unlabored on room air Skin is warm and dry, right hand with swelling extending outward from third digit, redness, induration Alert and oriented Pleasant and congruent affect  Labs, images, and other studies Mild hyponatremia Stable, mild anemia  Assessment and plan Hospital day 0  Duane King is a 37 y.o. admitted for recurrent right upper extremity third digit tenosynovitis.  Principal Problem:   Tenosynovitis of right hand Active Problems:   Polysubstance abuse (HCC)  Tenosynovitis of right hand Status post I&D this morning.  Cultures from prior episode earlier this month grew MRSA.  Continue IV antibiotics for now, can switch to oral pending cultures and susceptibilities and once pain is better controlled and he is ready for discharge. - Appreciate Ortho and pharmacy assistance with this case - Continue vancomycin - Scheduled Tylenol - As needed oxycodone for moderate pain - As needed hydromorphone for severe pain  Diet: Regular IVF: None VTE: heparin injection 5,000 Units Start: 06/19/23 2200 SCDs Start: 06/19/23 0319  Code: Full PT/OT recommendations: N/A TOC recommendations: N/A  Discharge plan: Pending microbial cultures and susceptibilities  Marrianne Mood MD 06/19/2023, 2:42 PM  Pager: [phone] After 5pm or weekend: [phone]

## 2023-06-19 NOTE — Progress Notes (Signed)
Pharmacy Antibiotic Note  Duane King is a 37 y.o. male admitted on 06/18/2023 with MRSA tenosynovitis right hand.  Pharmacy has been consulted for Vancomycin dosing.  Weight = 80 kg (05/24/2023)  Plan: Vancomycin 1500 mg IV Q 12 hrs. Goal AUC 400-550.  Expected AUC: 487.2  SCr used: 0.8 (rounded up from 0.76) Follow renal function F/u culture results and sensitivities Monitor vancomycin levels as needed      Temp (24hrs), Avg:98.1 F (36.7 C), Min:98.1 F (36.7 C), Max:98.1 F (36.7 C)  Recent Labs  Lab 06/19/23 0121 06/19/23 0134  WBC 4.5  --   CREATININE 0.76  --   LATICACIDVEN  --  2.1*    CrCl cannot be calculated (Unknown ideal weight.).    No Known Allergies  Antimicrobials this admission: 8/30 Vancomycin >>      Dose adjustments this admission:   Microbiology results: 8/30 BCx:      Thank you for allowing pharmacy to be a part of this patient's care.  Maryellen Pile, PharmD 06/19/2023 3:19 AM
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
## 2023-06-19 NOTE — Progress Notes (Signed)
A consult was received from an ED physician for Vancomycin per pharmacy dosing.  The patient's profile has been reviewed for ht/wt/allergies/indication/available labs.    Weight = 80 kg (05/24/23)  A one time order has been placed for Vancomycin 1500mg  IV.    Further antibiotics/pharmacy consults should be ordered by admitting physician if indicated.                       Thank you, Maryellen Pile, PharmD 06/19/2023  12:42 AM

## 2023-06-19 NOTE — Op Note (Unsigned)
NAMEMARICUS, RICCIARDELLI MEDICAL RECORD NO: [id number] ACCOUNT NO: [id number] DATE OF BIRTH: [birth year] FACILITY: [facility] LOCATION: [facility] PHYSICIAN: Myalee Stengel C. Izora Ribas, MD  Operative Report   DATE OF PROCEDURE: 06/19/2023  PREOPERATIVE DIAGNOSIS:  Status post I and D of the right hand with recurrent infection and tenosynovitis of the right hand and right long finger.  POSTOPERATIVE DIAGNOSIS:  Status post I and D of the right hand with recurrent infection and tenosynovitis of the right hand and right long finger.  PROCEDURE:  Extensive incision and drainage of the right hand including the flexor tendon sheath of the right long finger as well as the dorsal extensor tendon sheath.  ANESTHESIA:  IV sedation.  ESTIMATED BLOOD LOSS:  50 mL.  No specimens.  No acute complications.  INDICATIONS:  The patient is a 37 year old male who had an I and D performed back on 8/5 for tenosynovitis.  He was lost to follow up and returned to the ER early this morning with signs and symptoms of recurrent infection.  He was consented and prepared  for OR today.  DESCRIPTION OF PROCEDURE:  The patient was taken to the operating room, placed supine on the operating room table.  Timeout was performed.  Anesthesia was administered.  The right upper extremity was prepped and draped in the normal sterile fashion.  The  old sutures in the right palm were removed.  The wound opened and copious amounts of purulence was obtained.  This was evacuated.  The sutures in the long finger were also removed with less evidence of purulent fluid there.  There was some full  thickness skin that was debrided on the volar aspect of the right long finger. Compression of the hand revealed deep infection that seemed to be emanating from the medial and lateral side of the 3rd metacarpal into the dorsum of the han. An incision was made in the  dorsum of the hand, the subcutaneous tissues were explored and the extensor tendon sheath opened.   Extensive irrigation of this entire area was then performed. Again compression of the area did not reveal any additional purulence beside what was initially encountered.  After thorough irrigation, hemostasis was  controlled with direct pressure.  The wounds were packed with sterile gauze, with a sterile dressing covering.   SHY D: 06/19/2023 12:54:07 pm T: 06/19/2023 1:12:00 pm  JOB: [id number]/ [id number]

## 2023-06-19 NOTE — Anesthesia Preprocedure Evaluation (Signed)
Anesthesia Evaluation  Patient identified by MRN, date of birth, ID band Patient awake    Reviewed: Allergy & Precautions, H&P , NPO status , Patient's Chart, lab work & pertinent test results  Airway Mallampati: II   Neck ROM: full    Dental   Pulmonary Current Smoker and Patient abstained from smoking.   breath sounds clear to auscultation       Cardiovascular negative cardio ROS  Rhythm:regular Rate:Normal     Neuro/Psych    GI/Hepatic   Endo/Other    Renal/GU      Musculoskeletal   Abdominal   Peds  Hematology   Anesthesia Other Findings   Reproductive/Obstetrics                             Anesthesia Physical Anesthesia Plan  ASA: 2  Anesthesia Plan: General   Post-op Pain Management:    Induction: Intravenous  PONV Risk Score and Plan: 1 and Ondansetron, Dexamethasone, Midazolam and Treatment may vary due to age or medical condition  Airway Management Planned: LMA  Additional Equipment:   Intra-op Plan:   Post-operative Plan: Extubation in OR  Informed Consent: I have reviewed the patients History and Physical, chart, labs and discussed the procedure including the risks, benefits and alternatives for the proposed anesthesia with the patient or authorized representative who has indicated his/her understanding and acceptance.     Dental advisory given  Plan Discussed with: CRNA, Anesthesiologist and Surgeon  Anesthesia Plan Comments:        Anesthesia Quick Evaluation

## 2023-06-19 NOTE — ED Provider Notes (Signed)
[place] EMERGENCY DEPARTMENT AT [facility] Provider Note   CSN: [id number] Arrival date & time: 06/18/23  2350     History  Chief Complaint  Patient presents with   Hand Problem    Duane King is a 37 y.o. male.  HPI   Patient with medical history including polysubstance use, right tenosynovitis status post I&D by Dr. Jacalyn Lefevre presenting with complaints of right hand pain.  Patient right hand pain started about a few days ago, came on suddenly, states she has worsening swelling around his middle finger, states he is unable to fully extend it due to pain, states he is having some pleuritic discharge coming from his surgical wound, states he still has the stitches in place, states he did not follow-up with hand surgery after procedure.  He denies any fevers chills cough congestion, he has no other complaints  Reviewed patient's chart was admitted on 04/08, underwent I&D on the fifth, cultures were obtained and grew out staph, pansensitive, started on vancomycin, discharged home on doxycycline.  Home Medications Prior to Admission medications   Not on File      Allergies    Patient has no known allergies.    Review of Systems   Review of Systems  Constitutional:  Negative for chills and fever.  Respiratory:  Negative for shortness of breath.   Cardiovascular:  Negative for chest pain.  Gastrointestinal:  Negative for abdominal pain.  Musculoskeletal:        Hand pain  Neurological:  Negative for headaches.    Physical Exam Updated Vital Signs BP 120/81   Pulse 86   Temp 99.7 F (37.6 C) (Oral)   Resp 18   SpO2 100%  Physical Exam Vitals and nursing note reviewed.  Constitutional:      General: He is not in acute distress.    Appearance: He is not ill-appearing.  HENT:     Head: Normocephalic and atraumatic.     Nose: No congestion.  Eyes:     Conjunctiva/sclera: Conjunctivae normal.  Cardiovascular:     Rate and Rhythm: Normal rate and  regular rhythm.     Pulses: Normal pulses.     Heart sounds: No murmur heard.    No friction rub. No gallop.  Pulmonary:     Effort: No respiratory distress.     Breath sounds: No wheezing, rhonchi or rales.  Musculoskeletal:     Comments: Focused exam of the right hand reveals hand which is contracted, there is notable erythema around the second third and fourth digit but mainly around the third digit with associated erythema.  Patient is able to minimally extends the third digit appears to be some purulent discharge, with sutures which appear to be intact on the palmar aspect along the third metacarpal, exam is limited as patient is unable to fully extend his fingers.  Patient has decent capillary refill in all fingers, all compartments soft, sensation intact in all fingers please see picture for full detail  Skin:    General: Skin is warm and dry.  Neurological:     Mental Status: He is alert.  Psychiatric:        Mood and Affect: Mood normal.          ED Results / Procedures / Treatments   Labs (all labs ordered are listed, but only abnormal results are displayed) Labs Reviewed  CBC WITH DIFFERENTIAL/PLATELET - Abnormal; Notable for the following components:      Result Value  Hemoglobin 12.1 (*)    HCT 37.5 (*)    RDW 11.4 (*)    Lymphs Abs 0.6 (*)    All other components within normal limits  BASIC METABOLIC PANEL - Abnormal; Notable for the following components:   Sodium 133 (*)    Chloride 95 (*)    Glucose, Bld 100 (*)    Calcium 8.6 (*)    All other components within normal limits  I-STAT CG4 LACTIC ACID, ED - Abnormal; Notable for the following components:   Lactic Acid, Venous 2.1 (*)    All other components within normal limits  CULTURE, BLOOD (ROUTINE X 2)  CULTURE, BLOOD (ROUTINE X 2)  PROTIME-INR  RAPID URINE DRUG SCREEN, HOSP PERFORMED  I-STAT CG4 LACTIC ACID, ED  TYPE AND SCREEN  ABO/RH    EKG None  Radiology DG Hand Complete Right  Result  Date: 06/19/2023 CLINICAL DATA:  Hand surgery 2 months ago. Believes staph infection is back. Infection of the distal third metacarpal. Pain. And is swollen and warm. EXAM: RIGHT HAND - COMPLETE 3+ VIEW COMPARISON:  Radiograph 05/23/2023 FINDINGS: Digits are held in flexion on all images. No definite fracture or dislocation. No evidence of osteomyelitis. Soft tissue swelling about the third finger. IMPRESSION: Soft tissue swelling about the third finger without radiographic evidence of osteomyelitis. Electronically Signed   By: Minerva Fester M.D.   On: 06/19/2023 01:05    Procedures Procedures    Medications Ordered in ED Medications  sodium chloride 0.9 % 1,000 mL with potassium chloride 30 mEq infusion (has no administration in time range)  heparin injection 5,000 Units (has no administration in time range)  ondansetron (ZOFRAN) tablet 4 mg (has no administration in time range)    Or  ondansetron (ZOFRAN) injection 4 mg (has no administration in time range)  acetaminophen (TYLENOL) tablet 650 mg (has no administration in time range)    Or  acetaminophen (TYLENOL) suppository 650 mg (has no administration in time range)  oxyCODONE (Oxy IR/ROXICODONE) immediate release tablet 5 mg ( Oral See Alternative 06/19/23 0348)    Or  oxyCODONE (Oxy IR/ROXICODONE) immediate release tablet 10 mg ( Oral See Alternative 06/19/23 0348)    Or  HYDROmorphone (DILAUDID) injection 1 mg (1 mg Intravenous Given 06/19/23 0348)  docusate sodium (COLACE) capsule 100 mg (100 mg Oral Given 06/19/23 0348)  vancomycin (VANCOREADY) IVPB 1500 mg/300 mL (has no administration in time range)  sodium chloride 0.9 % bolus 1,000 mL (1,000 mLs Intravenous New Bag/Given 06/19/23 0121)  ondansetron (ZOFRAN) injection 4 mg (4 mg Intravenous Given 06/19/23 0105)  HYDROmorphone (DILAUDID) injection 1 mg (1 mg Intravenous Given 06/19/23 0105)  vancomycin (VANCOREADY) IVPB 1500 mg/300 mL (1,500 mg Intravenous New Bag/Given 06/19/23 0130)     ED Course/ Medical Decision Making/ A&P                                 Medical Decision Making Amount and/or Complexity of Data Reviewed Labs: ordered. Radiology: ordered.  Risk Prescription drug management. Decision regarding hospitalization.   This patient presents to the ED for concern of hand pain, this involves an extensive number of treatment options, and is a complaint that carries with it a high risk of complications and morbidity.  The differential diagnosis includes fracture, dislocation, cellulitis, abscess, osteomyelitis, flexor tendon synovitis    Additional history obtained:  Additional history obtained from N/A External records from outside source obtained and  reviewed including recent hospitalization   Co morbidities that complicate the patient evaluation  N/A  Social Determinants of Health:  Polysubstance use    Lab Tests:  I Ordered, and personally interpreted labs.  The pertinent results include: CBC shows normocytic anemia, BMP reveals sodium 133 chloride 95 glucose 100, calcium 8.6, prothrombin time 12.6, INR 0.9 lactic 2.1  Imaging Studies ordered:  I ordered imaging studies including plain film of the right hand I independently visualized and interpreted imaging which showed negative acute findings I agree with the radiologist interpretation   Cardiac Monitoring:  The patient was maintained on a cardiac monitor.  I personally viewed and interpreted the cardiac monitored which showed an underlying rhythm of: N/A   Medicines ordered and prescription drug management:  I ordered medication including antibiotics, pain medication I have reviewed the patients home medicines and have made adjustments as needed  Critical Interventions:  N/A   Reevaluation:  Presents with hand pain, presentation seems consistent with tenosynovitis, previous cultures were sensitive for vancomycin, will restart vancomycin, obtain screening lab workup,  consult with hand surgery for further recommendations.  Consultations Obtained:  I requested consultation with the Dr. Izora Ribas,  and discussed lab and imaging findings as well as pertinent plan - they recommend: Admit to medicine keep n.p.o. will evaluate in the morning likely needing I&D Spoke with Dr. Imogene Burn hospitalist team he will admit the patient    Test Considered:  N/A    Rule out  Low suspicion for fracture or dislocation as x-ray does not feel any significant findings.  Low suspicion for compartment syndrome as area was palpated it was soft to the touch, neurovascular fully intact.     Dispostion and problem list  After consideration of the diagnostic results and the patients response to treatment, I feel that the patent would benefit from admission.  Tenosynovitis-started on IV antibiotics, will need formal consultation by hand surgery and likely need I&D/debridement.            Final Clinical Impression(s) / ED Diagnoses Final diagnoses:  Tenosynovitis of hand    Rx / DC Orders ED Discharge Orders     None         Carroll Sage, PA-C 06/19/23 0352    Nira Conn, MD 06/19/23 [phone]
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
## 2023-06-20 ENCOUNTER — Encounter (HOSPITAL_COMMUNITY): Payer: Self-pay | Admitting: General Surgery

## 2023-06-20 ENCOUNTER — Other Ambulatory Visit: Payer: Self-pay

## 2023-06-20 DIAGNOSIS — M009 Pyogenic arthritis, unspecified: Secondary | ICD-10-CM | POA: Diagnosis not present

## 2023-06-20 DIAGNOSIS — F17210 Nicotine dependence, cigarettes, uncomplicated: Secondary | ICD-10-CM | POA: Diagnosis present

## 2023-06-20 DIAGNOSIS — Z8614 Personal history of Methicillin resistant Staphylococcus aureus infection: Secondary | ICD-10-CM | POA: Diagnosis not present

## 2023-06-20 DIAGNOSIS — M65841 Other synovitis and tenosynovitis, right hand: Secondary | ICD-10-CM

## 2023-06-20 DIAGNOSIS — M65141 Other infective (teno)synovitis, right hand: Secondary | ICD-10-CM | POA: Diagnosis present

## 2023-06-20 DIAGNOSIS — M79641 Pain in right hand: Secondary | ICD-10-CM | POA: Diagnosis present

## 2023-06-20 MED ORDER — ACETAMINOPHEN 500 MG PO TABS
1000.0000 mg | ORAL_TABLET | Freq: Three times a day (TID) | ORAL | Status: DC
  Administered 2023-06-21 – 2023-06-22 (×2): 1000 mg via ORAL
  Filled 2023-06-20 (×4): qty 2

## 2023-06-20 NOTE — Plan of Care (Signed)
Problem: Education: Goal: Knowledge of General Education information will improve Description: Including pain rating scale, medication(s)/side effects and non-pharmacologic comfort measures Outcome: Progressing Pt understands he was admitted into the hospital for right middle finger pain and swelling.  He previously had an  irrigation and debridement of right middle finger on 8/5.  Pt is back for complication of his right hand.     Problem: Clinical Measurements: Goal: Ability to maintain clinical measurements within normal limits will improve Outcome: Progressing Pt's VS WNL thus far.  He was slightly hypotensive this AM but VS improved over the shift.   Problem: Clinical Measurements: Goal: Will remain free from infection Outcome: Progressing S/Sx of infection monitored and assessed q8 hours.  Pt has remained afebrile thus far.  He is on IV abx per MD's orders.    Problem: Clinical Measurements: Goal: Respiratory complications will improve Outcome: Progressing Respiratory status monitored and assessed q8 hours.  Pt is on room air with PO2 saturations at 100% and respirations of 16 breaths per minute.     Problem: Activity: Goal: Risk for activity intolerance will decrease Outcome: Progressing Pt is independent of all his ADLs.  He was observed OOB ambulating in his room with a steady gait..    Problem: Nutrition: Goal: Adequate nutrition will be maintained Outcome: Progressing Pt is on a regular diet per MD's orders.  He has been able to tolerate his diet without s/sx of n/v or abdominal pain/ distention.  Pt is NPO after midnight for a surgical procedure of his right hand.    Problem: Elimination: Goal: Will not experience complications related to bowel motility Outcome: Progressing Pt LBM was in 06/19/2023.  He has endorsed c/o constipation.    Problem: Elimination: Goal: Will not experience complications related to urinary retention Outcome: Progressing Pt utilizes the  urinal to capture his urine.  He has denied c/o dysuria or abdominal distention/ pain.   Problem: Pain Management: Goal: General experience of comfort will improve Outcome: Progressing Pt has endorsed c/o 4-8/10 right hand pain describing it as a constant ache. Reiterated pain scale so he could adequately rate his pain. Pt stated his pain goal this admission would be 0/10. Discussed nonpharmacological methods to help reduce s/sx of pain. Interventions given per pt's request and MD's orders.    Problem: Safety: Goal: Ability to remain free from injury will improve Outcome: Progressing Pt has remained free from falls thus far.  Instructed pt to utilize RN call light for assistance.  Hourly rounds performed.  Bed in lowest position, locked with two upper side rails engaged.  Belongings and call light within reach.    Problem: Skin Integrity: Goal: Risk for impaired skin integrity will decrease Outcome: Progressing Skin integrity monitored and assessed q-shift.  Instructed pt to turn/ reposition himself q2 hours to prevent further skin impairment.  Tubes and drains assessed for device related pressure sores.  Pt is continent of both bowel and bladder.  Dressing changes performed per MD's orders.

## 2023-06-20 NOTE — Progress Notes (Signed)
S:  hand sore  O:Blood pressure (!) 99/54, pulse 78, temperature 99.2 F (37.3 C), temperature source Oral, resp. rate 16, height 5\' 8"  (1.727 m), weight 80 kg, SpO2 100%.  Dressing, packing removed, no purulence, but hand still significantly swollen  A:s/p I&D   P: will start washouts today, npo after midnight for possible washout in OR tomorrow.

## 2023-06-20 NOTE — Progress Notes (Signed)
                 Subjective Patient is doing well overall and states pain is much improved from yesterday.  Physical exam Blood pressure 112/68, pulse 76, temperature 98.3 F (36.8 C), temperature source Oral, resp. rate 16, height 5\' 8"  (1.727 m), weight 80 kg, SpO2 100%.  Physical Exam: Constitutional: well-appearing, sitting in chair, in no acute distress Cardiovascular: regular rate and rhythm, no m/r/g, no LEE Pulmonary/Chest: normal work of breathing on room air, lungs clear to auscultation bilaterally Abdominal: soft, non-tender, non-distended MSK: Dressing covering right hand  Skin: diaphoretic Psych: normal mood and behavior   Weight change:    Intake/Output Summary (Last 24 hours) at 06/20/2023 1028 Last data filed at 06/20/2023 0924 Gross per 24 hour  Intake 1130 ml  Output 2 ml  Net 1128 ml   Net IO Since Admission: 1,128 mL [06/20/23 1028]  Labs, images, and other studies    Latest Ref Rng & Units 06/19/2023    1:21 AM 05/27/2023    1:14 AM 05/26/2023    1:54 AM  CBC  WBC 4.0 - 10.5 K/uL 4.5  3.5  4.2   Hemoglobin 13.0 - 17.0 g/dL 16.1  09.6  04.5   Hematocrit 39.0 - 52.0 % 37.5  36.4  34.7   Platelets 150 - 400 K/uL 170  274  243        Latest Ref Rng & Units 06/19/2023    1:21 AM 05/26/2023    1:54 AM 05/23/2023    7:28 PM  BMP  Glucose 70 - 99 mg/dL 409  811  914   BUN 6 - 20 mg/dL 10  10  10    Creatinine 0.61 - 1.24 mg/dL 7.82  9.56  2.13   Sodium 135 - 145 mmol/L 133  138  139   Potassium 3.5 - 5.1 mmol/L 3.5  3.6  3.7   Chloride 98 - 111 mmol/L 95  102  99   CO2 22 - 32 mmol/L 25  25  25    Calcium 8.9 - 10.3 mg/dL 8.6  8.3  9.3     Assessment and plan Hospital day 2  Duane King is a 37 y.o. admitted for recurrent tenosynovitis of right hand/third digit.  Principal Problem:   Tenosynovitis of right hand Active Problems:   Polysubstance abuse (HCC)  #Tenosynovitis of Right Hand/Third Digit Patient had I&D yesterday. Cultures from prior  episode earlier this month grew MRSA. Patient started on IV Vancomycin 1.5 g q 12 and is currently on day 2. Hand surgery saw patient today and noted no purulence but significant swelling - "will start washouts today, NPO after midnight for possible washout in OR tomorrow".  - Appreciate Hand Surgery and Pharmacy - Continue Vancomycin - Scheduled Tylenol - As needed oxycodone for moderate pain - As needed hydromorphone for severe pain  Diet: Regular; NPO after midnight IVF: None VTE: heparin injection 5,000 Units Start: 06/19/23 2200 SCDs Start: 06/19/23 0319  Code: Full PT/OT recommendations: N/A TOC recommendations: N/A  Discharge plan: Pending surgery recommendations  Carmina Miller, DO 06/20/2023, 10:28 AM  Pager: [phone] After 5pm or weekend: [phone]

## 2023-06-21 ENCOUNTER — Inpatient Hospital Stay (HOSPITAL_COMMUNITY): Payer: Medicaid Other | Admitting: Anesthesiology

## 2023-06-21 ENCOUNTER — Encounter (HOSPITAL_COMMUNITY): Payer: Self-pay | Admitting: Internal Medicine

## 2023-06-21 ENCOUNTER — Encounter (HOSPITAL_COMMUNITY)
Admission: EM | Disposition: A | Discharge status: Home / Self Care | Payer: Self-pay | Source: Home / Self Care | Attending: Internal Medicine

## 2023-06-21 ENCOUNTER — Other Ambulatory Visit: Payer: Self-pay

## 2023-06-21 DIAGNOSIS — M009 Pyogenic arthritis, unspecified: Secondary | ICD-10-CM | POA: Diagnosis not present

## 2023-06-21 HISTORY — PX: I & D EXTREMITY: SHX5045

## 2023-06-21 LAB — BASIC METABOLIC PANEL
Anion gap: 10 (ref 5–15)
BUN: 10 mg/dL (ref 6–20)
CO2: 22 mmol/L (ref 22–32)
Calcium: 8.5 mg/dL — ABNORMAL LOW (ref 8.9–10.3)
Chloride: 102 mmol/L (ref 98–111)
Creatinine, Ser: 0.82 mg/dL (ref 0.61–1.24)
GFR, Estimated: >60 mL/min (ref >60)
Glucose, Bld: 100 mg/dL — ABNORMAL HIGH (ref 70–99)
Potassium: 4.2 mmol/L (ref 3.5–5.1)
Sodium: 134 mmol/L — ABNORMAL LOW (ref 135–145)

## 2023-06-21 LAB — SURGICAL PCR SCREEN
MRSA, PCR: POSITIVE — AB
Staphylococcus aureus: POSITIVE — AB

## 2023-06-21 LAB — TYPE AND SCREEN
ABO/RH(D): B POS
Antibody Screen: NEGATIVE

## 2023-06-21 SURGERY — IRRIGATION AND DEBRIDEMENT EXTREMITY
Anesthesia: Monitor Anesthesia Care | Laterality: Right

## 2023-06-21 MED ORDER — HYDROMORPHONE HCL 1 MG/ML IJ SOLN
INTRAMUSCULAR | Status: DC | PRN
Start: 2023-06-21 — End: 2023-06-21
  Administered 2023-06-21: .5 mg via INTRAVENOUS

## 2023-06-21 MED ORDER — CHLORHEXIDINE GLUCONATE 0.12 % MT SOLN
OROMUCOSAL | Status: AC
  Filled 2023-06-21: qty 15

## 2023-06-21 MED ORDER — OXYCODONE HCL 5 MG PO TABS: 5.0000 mg | ORAL_TABLET | Freq: Once | ORAL | Status: DC | PRN

## 2023-06-21 MED ORDER — FENTANYL CITRATE (PF) 100 MCG/2ML IJ SOLN: 25.0000 ug | INTRAMUSCULAR | Status: DC | PRN

## 2023-06-21 MED ORDER — CHLORHEXIDINE GLUCONATE 0.12 % MT SOLN: 15.0000 mL | Freq: Once | OROMUCOSAL | Status: AC

## 2023-06-21 MED ORDER — ONDANSETRON HCL 4 MG/2ML IJ SOLN: 4.0000 mg | Freq: Four times a day (QID) | INTRAMUSCULAR | Status: DC | PRN

## 2023-06-21 MED ORDER — ORAL CARE MOUTH RINSE: 15.0000 mL | Freq: Once | OROMUCOSAL | Status: AC

## 2023-06-21 MED ORDER — BUPIVACAINE HCL (PF) 0.25 % IJ SOLN
INTRAMUSCULAR | Status: DC | PRN
  Administered 2023-06-21: 19 mL

## 2023-06-21 MED ORDER — PROPOFOL 10 MG/ML IV BOLUS
INTRAVENOUS | Status: DC | PRN
  Administered 2023-06-21: 50 mg via INTRAVENOUS

## 2023-06-21 MED ORDER — DEXAMETHASONE SODIUM PHOSPHATE 10 MG/ML IJ SOLN
INTRAMUSCULAR | Status: AC
  Filled 2023-06-21: qty 1

## 2023-06-21 MED ORDER — DEXAMETHASONE SODIUM PHOSPHATE 10 MG/ML IJ SOLN
INTRAMUSCULAR | Status: DC | PRN
  Administered 2023-06-21: 5 mg via INTRAVENOUS

## 2023-06-21 MED ORDER — SODIUM CHLORIDE 0.9 % IR SOLN
Status: DC | PRN
  Administered 2023-06-21: 1000 mL

## 2023-06-21 MED ORDER — MIDAZOLAM HCL 2 MG/2ML IJ SOLN
INTRAMUSCULAR | Status: DC | PRN
  Administered 2023-06-21: 2 mg via INTRAVENOUS

## 2023-06-21 MED ORDER — OXYCODONE HCL 5 MG/5ML PO SOLN: 5.0000 mg | Freq: Once | ORAL | Status: DC | PRN

## 2023-06-21 MED ORDER — ONDANSETRON HCL 4 MG/2ML IJ SOLN
INTRAMUSCULAR | Status: DC | PRN
  Administered 2023-06-21: 4 mg via INTRAVENOUS

## 2023-06-21 MED ORDER — HYDROMORPHONE HCL 1 MG/ML IJ SOLN
INTRAMUSCULAR | Status: AC
  Filled 2023-06-21: qty 0.5

## 2023-06-21 MED ORDER — ONDANSETRON HCL 4 MG/2ML IJ SOLN
INTRAMUSCULAR | Status: AC
  Filled 2023-06-21: qty 2

## 2023-06-21 MED ORDER — DEXMEDETOMIDINE HCL IN NACL 80 MCG/20ML IV SOLN
INTRAVENOUS | Status: AC
  Filled 2023-06-21: qty 20

## 2023-06-21 MED ORDER — LACTATED RINGERS IV SOLN: INTRAVENOUS | Status: DC

## 2023-06-21 MED ORDER — SODIUM CHLORIDE 0.9 % IR SOLN
Status: DC | PRN
Start: 2023-06-21 — End: 2023-06-21
  Administered 2023-06-21: 3000 mL

## 2023-06-21 MED ORDER — IRRISEPT - 450ML BOTTLE WITH 0.05% CHG IN STERILE WATER, USP 99.95% OPTIME
TOPICAL | Status: DC | PRN
Start: 2023-06-21 — End: 2023-06-21
  Administered 2023-06-21: 450 mL

## 2023-06-21 MED ORDER — PROPOFOL 500 MG/50ML IV EMUL
INTRAVENOUS | Status: DC | PRN
  Administered 2023-06-21: 125 ug/kg/min via INTRAVENOUS

## 2023-06-21 MED ORDER — MIDAZOLAM HCL 2 MG/2ML IJ SOLN
INTRAMUSCULAR | Status: AC
  Filled 2023-06-21: qty 2

## 2023-06-21 MED ORDER — BUPIVACAINE HCL (PF) 0.25 % IJ SOLN
INTRAMUSCULAR | Status: AC
  Filled 2023-06-21: qty 20

## 2023-06-21 MED ORDER — LIDOCAINE 2% (20 MG/ML) 5 ML SYRINGE
INTRAMUSCULAR | Status: DC | PRN
  Administered 2023-06-21: 1 mL via INTRAVENOUS

## 2023-06-21 MED ORDER — PROPOFOL 10 MG/ML IV BOLUS
INTRAVENOUS | Status: AC
  Filled 2023-06-21: qty 20

## 2023-06-21 SURGICAL SUPPLY — 26 items
BAG COUNTER SPONGE SURGICOUNT (BAG) ×1 IMPLANT
BAG DECANTER FOR FLEXI CONT (MISCELLANEOUS) ×1 IMPLANT
BAG SPNG CNTER NS LX DISP (BAG) ×1
DRAPE SURG 17X23 STRL (DRAPES) ×1 IMPLANT
DRSG TUBE GAUZE 1X5YD SZ2 (GAUZE/BANDAGES/DRESSINGS) IMPLANT
GAUZE SPONGE 4X4 12PLY STRL (GAUZE/BANDAGES/DRESSINGS) ×1 IMPLANT
GAUZE XEROFORM 1X8 LF (GAUZE/BANDAGES/DRESSINGS) ×1 IMPLANT
GAUZE XEROFORM 5X9 LF (GAUZE/BANDAGES/DRESSINGS) IMPLANT
GLOVE BIOGEL M 8.0 STRL (GLOVE) ×1 IMPLANT
GOWN STRL REUS W/ TWL LRG LVL3 (GOWN DISPOSABLE) ×2 IMPLANT
GOWN STRL REUS W/TWL LRG LVL3 (GOWN DISPOSABLE) ×2
HANDPIECE INTERPULSE COAX TIP (DISPOSABLE) ×1
KIT BASIN OR (CUSTOM PROCEDURE TRAY) ×1 IMPLANT
KIT TURNOVER KIT B (KITS) ×1 IMPLANT
MANIFOLD NEPTUNE II (INSTRUMENTS) ×1 IMPLANT
NS IRRIG 1000ML POUR BTL (IV SOLUTION) ×1 IMPLANT
PACK ORTHO EXTREMITY (CUSTOM PROCEDURE TRAY) ×1 IMPLANT
PAD ARMBOARD 7.5X6 YLW CONV (MISCELLANEOUS) ×2 IMPLANT
SET HNDPC FAN SPRY TIP SCT (DISPOSABLE) IMPLANT
SOAP 2 % CHG 4 OZ (WOUND CARE) ×1 IMPLANT
SPONGE T-LAP 4X18 ~~LOC~~+RFID (SPONGE) ×1 IMPLANT
TOWEL GREEN STERILE (TOWEL DISPOSABLE) ×1 IMPLANT
TOWEL GREEN STERILE FF (TOWEL DISPOSABLE) ×1 IMPLANT
TUBE CONNECTING 12X1/4 (SUCTIONS) ×1 IMPLANT
WATER STERILE IRR 1000ML POUR (IV SOLUTION) ×1 IMPLANT
YANKAUER SUCT BULB TIP NO VENT (SUCTIONS) ×1 IMPLANT

## 2023-06-21 NOTE — Plan of Care (Signed)

## 2023-06-21 NOTE — Anesthesia Postprocedure Evaluation (Signed)
Anesthesia Post Note  Patient: Duane King  Procedure(s) Performed: IRRIGATION AND DEBRIDEMENT OF LONG FINGER (Right)     Patient location during evaluation: PACU Anesthesia Type: General Level of consciousness: awake and alert Pain management: pain level controlled Vital Signs Assessment: post-procedure vital signs reviewed and stable Respiratory status: spontaneous breathing, nonlabored ventilation, respiratory function stable and patient connected to nasal cannula oxygen Cardiovascular status: stable and blood pressure returned to baseline Postop Assessment: no apparent nausea or vomiting Anesthetic complications: no   No notable events documented.  Last Vitals:  Vitals:   06/21/23 0453 06/21/23 0723  BP: (!) 105/57 95/64  Pulse: 71 (!) 56  Resp: 18 16  Temp: 37.8 C 37.1 C  SpO2: 99% 100%    Last Pain:  Vitals:   06/21/23 0723  TempSrc: Oral  PainSc:                  Ikeisha Blumberg S

## 2023-06-21 NOTE — Anesthesia Preprocedure Evaluation (Signed)
Anesthesia Evaluation  Patient identified by MRN, date of birth, ID band Patient awake    Reviewed: Allergy & Precautions, H&P , NPO status , Patient's Chart, lab work & pertinent test results  Airway Mallampati: II   Neck ROM: full    Dental   Pulmonary Current Smoker and Patient abstained from smoking.   breath sounds clear to auscultation       Cardiovascular negative cardio ROS  Rhythm:regular Rate:Normal     Neuro/Psych    GI/Hepatic ,,,(+)     substance abuse    Endo/Other    Renal/GU      Musculoskeletal   Abdominal   Peds  Hematology   Anesthesia Other Findings   Reproductive/Obstetrics                             Anesthesia Physical Anesthesia Plan  ASA: 2  Anesthesia Plan: General   Post-op Pain Management:    Induction: Intravenous  PONV Risk Score and Plan: 1 and Ondansetron, Dexamethasone, Midazolam and Treatment may vary due to age or medical condition  Airway Management Planned: LMA  Additional Equipment:   Intra-op Plan:   Post-operative Plan: Extubation in OR  Informed Consent: I have reviewed the patients History and Physical, chart, labs and discussed the procedure including the risks, benefits and alternatives for the proposed anesthesia with the patient or authorized representative who has indicated his/her understanding and acceptance.     Dental advisory given  Plan Discussed with: CRNA, Anesthesiologist and Surgeon  Anesthesia Plan Comments:        Anesthesia Quick Evaluation

## 2023-06-21 NOTE — Progress Notes (Signed)
                 Subjective Patient states hand is pain free.   Physical exam Blood pressure 99/70, pulse 66, temperature 99 F (37.2 C), temperature source Oral, resp. rate 17, height 5\' 7"  (1.702 m), weight 54.4 kg, SpO2 100%.  Physical Exam: Constitutional: well-appearing, lying in bed, in no acute distress Cardiovascular: regular rate and rhythm, no m/r/g, no LEE Pulmonary/Chest: normal work of breathing on room air, lungs clear to auscultation bilaterally Abdominal: soft, non-tender, non-distended MSK: Dressing covering right hand  Skin: warm and dry Psych: normal mood and behavior  Weight change: -24.3 kg   Intake/Output Summary (Last 24 hours) at 06/21/2023 1008 Last data filed at 06/21/2023 0359 Gross per 24 hour  Intake 840.24 ml  Output --  Net 840.24 ml   Net IO Since Admission: 1,968.24 mL [06/21/23 1008]  Labs, images, and other studies    Latest Ref Rng & Units 06/19/2023    1:21 AM 05/27/2023    1:14 AM 05/26/2023    1:54 AM  CBC  WBC 4.0 - 10.5 K/uL 4.5  3.5  4.2   Hemoglobin 13.0 - 17.0 g/dL 78.2  95.6  21.3   Hematocrit 39.0 - 52.0 % 37.5  36.4  34.7   Platelets 150 - 400 K/uL 170  274  243        Latest Ref Rng & Units 06/21/2023    5:23 AM 06/19/2023    1:21 AM 05/26/2023    1:54 AM  BMP  Glucose 70 - 99 mg/dL 086  578  469   BUN 6 - 20 mg/dL 10  10  10    Creatinine 0.61 - 1.24 mg/dL 6.29  5.28  4.13   Sodium 135 - 145 mmol/L 134  133  138   Potassium 3.5 - 5.1 mmol/L 4.2  3.5  3.6   Chloride 98 - 111 mmol/L 102  95  102   CO2 22 - 32 mmol/L 22  25  25    Calcium 8.9 - 10.3 mg/dL 8.5  8.6  8.3      Assessment and plan Hospital day 3  Duane King is a 37 y.o. admitted for recurrent tenosynovitis of right hand/third digit.   Principal Problem:   Tenosynovitis of right hand Active Problems:   Polysubstance abuse (HCC)   #Tenosynovitis of Right Hand/Third Digit Patient had I&D 8/31 but hand with continued swelling/erythema/drainage. Hand  Surgery proceeding with second I&D today. Continues on Vancomycin 1.5 g q 12 (Day 3). - Appreciate Hand Surgery and Pharmacy - Continue Vancomycin - Scheduled Tylenol - As needed oxycodone for moderate pain - As needed hydromorphone for severe pain   Diet: Regular IVF: None VTE: heparin injection 5,000 Units Start: 06/19/23 2200 SCDs Start: 06/19/23 0319  Code: Full PT/OT recommendations: N/A TOC recommendations: N/A   Discharge plan: Pending surgery recommendations    Carmina Miller, DO 06/21/2023, 10:08 AM  Pager: [phone] After 5pm or weekend: [phone]

## 2023-06-21 NOTE — Transfer of Care (Signed)
Immediate Anesthesia Transfer of Care Note  Patient: Duane King  Procedure(s) Performed: IRRIGATION AND DEBRIDEMENT, HAND (Right)  Patient Location: PACU  Anesthesia Type:MAC  Level of Consciousness: drowsy  Airway & Oxygen Therapy: Patient Spontanous Breathing and Patient connected to face mask oxygen  Post-op Assessment: Report given to RN and Post -op Vital signs reviewed and stable  Post vital signs: Reviewed and stable  Last Vitals:  Vitals Value Taken Time  BP 124/81 06/21/23 1034  Temp 36.9 C 06/21/23 1034  Pulse 78 06/21/23 1041  Resp 14 06/21/23 1041  SpO2 99 % 06/21/23 1041  Vitals shown include unfiled device data.  Last Pain:  Vitals:   06/21/23 1034  TempSrc:   PainSc: Asleep      Patients Stated Pain Goal: 0 (06/20/23 1115)  Complications: No notable events documented.

## 2023-06-21 NOTE — Progress Notes (Signed)
Pt seen and examined, hand overall improved, still some erythema/drainage;  will proceed with I&D again today.

## 2023-06-22 ENCOUNTER — Other Ambulatory Visit (HOSPITAL_COMMUNITY): Payer: Self-pay

## 2023-06-22 ENCOUNTER — Encounter (HOSPITAL_COMMUNITY): Payer: Self-pay | Admitting: General Surgery

## 2023-06-22 DIAGNOSIS — M65841 Other synovitis and tenosynovitis, right hand: Secondary | ICD-10-CM | POA: Diagnosis not present

## 2023-06-22 LAB — BASIC METABOLIC PANEL
Anion gap: 10 (ref 5–15)
BUN: 10 mg/dL (ref 6–20)
CO2: 26 mmol/L (ref 22–32)
Calcium: 9.1 mg/dL (ref 8.9–10.3)
Chloride: 103 mmol/L (ref 98–111)
Creatinine, Ser: 0.67 mg/dL (ref 0.61–1.24)
GFR, Estimated: >60 mL/min (ref >60)
Glucose, Bld: 112 mg/dL — ABNORMAL HIGH (ref 70–99)
Potassium: 3.7 mmol/L (ref 3.5–5.1)
Sodium: 139 mmol/L (ref 135–145)

## 2023-06-22 LAB — VANCOMYCIN, RANDOM: Vancomycin Rm: 16 ug/mL

## 2023-06-22 MED ORDER — ACETAMINOPHEN 500 MG PO TABS
1000.0000 mg | ORAL_TABLET | Freq: Three times a day (TID) | ORAL | 0 refills | Status: AC
  Filled 2023-06-22: qty 100, 17d supply, fill #0

## 2023-06-22 MED ORDER — VANCOMYCIN VARIABLE DOSE PER UNSTABLE RENAL FUNCTION (PHARMACIST DOSING): Status: DC

## 2023-06-22 MED ORDER — LINEZOLID 600 MG PO TABS
600.0000 mg | ORAL_TABLET | Freq: Two times a day (BID) | ORAL | 0 refills | Status: AC
  Filled 2023-06-22: qty 18, 9d supply, fill #0

## 2023-06-22 NOTE — Plan of Care (Addendum)
Discharge instructions discussed with patient.  Patient instructed on home medications, restrictions, and follow up appointments. Belongings gathered and sent with patient.  Patients medications to be picked up by at [facility] pharmacy  Discussed and demonstrated with patient wound care for hand/finger. Wound supplies given for 1 week of dressing change.   Discussed in detail the importance of cleaning the wound and making sure that he is packing the wound appropriately.  Patient was very hesitant on performing dressing change and would not allow this RN to ensure that the packing was in the wound enough. Patient was able to get close to 1/2 of the 4x4 gauze into the bed of the palm wound.  Showed patient how to cut and apply Xeroform dressing to middle finger and apply non adherent gauze to the top of his hand, covered with Kerlix. Patient stated that is concerned about doing the dressing changes twice daily but he will.  Encouraged the patient to take Tylenol 20-30 min prior to dressing change.  Discussed the importance of following up with Plastics/Hand surgery within a week, he will need to call the office on Wed. 9/4 to set up this follow up appt.

## 2023-06-22 NOTE — Progress Notes (Signed)
Pharmacy Antibiotic Note  Theory Duane King is a 37 y.o. male admitted on 06/18/2023 with MRSA tenosynovitis right hand.  Pharmacy has been consulted for Vancomycin dosing.   Weight decreased from 80kg on admit to 54kg. Vanc random level 5 hours after dose is 16. Team is discharging today on linezolid.   Plan Discharge on linezolid   Height: 5\' 7"  (170.2 cm) Weight: 54.4 kg (120 lb) IBW/kg (Calculated) : 66.1  Temp (24hrs), Avg:98.2 F (36.8 C), Min:97.5 F (36.4 C), Max:99 F (37.2 C)  Recent Labs  Lab 06/19/23 0121 06/19/23 0134 06/21/23 0523 06/22/23 0840  WBC 4.5  --   --   --   CREATININE 0.76  --  0.82 0.67  LATICACIDVEN  --  2.1*  --   --   VANCORANDOM  --   --   --  16    Estimated Creatinine Clearance: 98.2 mL/min (by C-G formula based on SCr of 0.67 mg/dL).    No Known Allergies  Antimicrobials this admission: 8/30 Vancomycin >>     Microbiology results: 8/31 BCx:  NGTD  9/2 MRSA +     Thank you for allowing pharmacy to be a part of this patient's care.   Alphia Moh, PharmD, BCPS, BCCP Clinical Pharmacist  Please check AMION for all [facility] phone numbers After 10:00 PM, call Main Pharmacy [phone]

## 2023-06-22 NOTE — Progress Notes (Signed)
S:  some hand pain, not bad  O:Blood pressure 109/64, pulse 64, temperature 97.9 F (36.6 C), resp. rate 18, height 5\' 7"  (1.702 m), weight 54.4 kg, SpO2 100%.  Dressing removed, much less erythema, edema  A:s/p I&D R hand RLF   P: pt to start washouts with soap and water 2-3x  daily, cover with nonstick guaze, gently repack hand wound; d/c on oral abx for 14 days.  F/U office next week

## 2023-06-22 NOTE — Discharge Instructions (Addendum)
You were treated for an infection in your hand with surgical repair and antibiotics. Please make sure to finish the 9 day supply of Linezolid (Antibiotic). Please also make sure to follow-up with hand surgery (contact information provided) and make an appointment as soon as possible.  Here are your wound care instructions in the meantime: Wash wound out with soap and water 2-3x daily, cover finger, dorsal hand with small piece of zeroform, gently pack hand wound then cover with kerlex.   We made you an appointment in the [facility] on 10/16 @ 9:15 with Dr. Versie Starks.

## 2023-06-22 NOTE — Progress Notes (Signed)
CSW notified by RN that pt needs transport assistance to get home. CSW gave bus pass to RN.

## 2023-06-22 NOTE — Progress Notes (Addendum)
Patient is unable to find shoes, he states that he took them off in the ambulance and unsure if they were transferred with him into the ED. Patient states that they are size 8 Under Armor color Black/ slides.   Attempting to notify Security, Lia Hopping made aware.   1711- Security to bedside to take report, Number for risk management given to patient.

## 2023-06-22 NOTE — Op Note (Signed)
NAMEMAICOL, BOBICK MEDICAL RECORD NO: [id number] ACCOUNT NO: [id number] DATE OF BIRTH: 24-May-1986 FACILITY: [facility] LOCATION: [facility] PHYSICIAN: Reena Borromeo C. Izora Ribas, MD  Operative Report   DATE OF PROCEDURE: 06/21/2023  PREOPERATIVE DIAGNOSIS:  Infection of the right long finger and right hand.   POSTOPERATIVE DIAGNOSIS:  Infection of the right long finger and right hand.  PROCEDURE:  Incision and drainage of right long finger and right hand.  ANESTHESIA:  IV sedation.  ESTIMATED BLOOD LOSS:  25 mL  COMPLICATIONS:  No acute complications.  SPECIMENS:  No specimens.  INDICATIONS:  The patient is a 37 year old man who is taken back to the OR for repeat washout of his severe right long finger and right hand infection.  Consent was obtained.  DESCRIPTION OF PROCEDURE:  The patient was taken to the operating room, placed supine on the operating table.  Anesthesia was administered.  A timeout was performed.  The patient had been on antibiotics and therefore no additional antibiotics were given.   The right upper extremity was prepped and draped in normal sterile fashion.  All packing and dressings were removed.  The wound was evaluated.  There was minimal purulence.  The wound was opened more proximally towards the transverse carpal ligament to  gain additional exposure.  Dissection was carried down to the subcutaneous tissues.  The A1 pulley of the right long finger was completely incised.  Next, thorough irrigation with pulse lavage was then performed.  This was performed on the right long  finger as well as the open wound of the hand as well as the dorsal wound.  Probing of the wound and squeezing did not reveal any additional purulence.  After this was complete, Marcaine was infiltrated around the wound for postoperative pain control.   Manipulation of the right long finger metacarpophalangeal and PIP joints was performed while the patient was asleep because already he had begun to develop a  flexion contracture.  Hemostasis was controlled with direct pressure.  Xeroform was placed in  the hand wound along the right long finger volar wound and the dorsal hand wound followed by 4 x 4's and wrapped with Kerlix.  The patient tolerated the procedure well and was taken to recovery room.       PAA D: 06/22/2023 10:11:45 am T: 06/22/2023 10:57:00 am  JOB: [id number]/ [id number]

## 2023-06-22 NOTE — Discharge Summary (Signed)
Name: Duane King MRN: [id number] DOB: [birth year] 37 y.o. PCP: Pcp, No  Date of Admission: 06/18/2023 11:50 PM Date of Discharge:  06/21/2023 Attending Physician: Dr. Criselda Peaches  DISCHARGE DIAGNOSIS:  Primary Problem: Tenosynovitis of right hand   Hospital Problems: Principal Problem:   Tenosynovitis of right hand Active Problems:   Polysubstance abuse (HCC)    DISCHARGE MEDICATIONS:   Allergies as of 06/22/2023   No Known Allergies      Medication List     TAKE these medications    acetaminophen 500 MG tablet Commonly known as: TYLENOL Take 2 tablets (1,000 mg total) by mouth every 8 (eight) hours for 5 days then as directed by MD   linezolid 600 MG tablet Commonly known as: ZYVOX Take 1 tablet (600 mg total) by mouth 2 (two) times daily.               Discharge Care Instructions  (From admission, onward)           Start     Ordered   06/22/23 0000  Discharge wound care:       Comments: Have patient wash hand at sink with soap and water, encourage him to squeeze palm to promote any drainage; dry; gently pack palm incision with 4x4; apply small piece of zeroform to middle finger incision; cover all with kerlex   06/22/23 1154            DISPOSITION AND FOLLOW-UP:  Mr.Duane King was discharged from [facility] in Good condition. At the hospital follow up visit please address:  Tenosynovitis of Right Hand/Third Digit -Ensure adequate healing -Ensure patient completed course of Linezolid -Ensure patient has followed-up with hand surgery  Health Maintenance  -Patient would benefit from broad screening for SDOH, health concerns, and drug use.   Follow-up Appointments:  Follow-up Information     Knute Neu, MD. Schedule an appointment as soon as possible for a visit in 1 week(s).   Specialty: General Surgery Contact information: [address]  [address] [place] [address] [phone]                  HOSPITAL COURSE:  Patient Summary:  Tenosynovitis of Right Hand/Third Digit Patient admitted 8/31 for recurrent tenosynovitis of right hand/third digit. Patient admitted earlier this month for same issue and was treated with I&D, Vancomycin, and discharged on Bactrim to complete 14 day course. Patient stated Bactrim course was completed. Patient presented after symptoms did not resolve. During this admission, patient treated with Vancomycin for 5 days, 2 I&D's and was discharged on Linezolid to complete 14 day course. Patient instructed to follow-up with both hand surgery and PCP appointment made for him with [facility]. DISCHARGE INSTRUCTIONS:   Discharge Instructions     Diet - low sodium heart healthy   Complete by: As directed    Discharge wound care:   Complete by: As directed    Have patient wash hand at sink with soap and water, encourage him to squeeze palm to promote any drainage; dry; gently pack palm incision with 4x4; apply small piece of zeroform to middle finger incision; cover all with kerlex   Increase activity slowly   Complete by: As directed        SUBJECTIVE:  Patient is feeling well and is ready for discharge.  Discharge Vitals:   BP 109/64 (BP Location: Left Arm)   Pulse 64   Temp 97.9 F (36.6 C)   Resp 18  Ht 5\' 7"  (1.702 m)   Wt 54.4 kg   SpO2 100%   BMI 18.79 kg/m   OBJECTIVE:  Physical Exam: Constitutional: well-appearing, lying in bed, in no acute distress Cardiovascular: regular rate and rhythm, no m/r/g Psych: normal mood and behavior   Pertinent Labs, Studies, and Procedures:     Latest Ref Rng & Units 06/19/2023    1:21 AM 05/27/2023    1:14 AM 05/26/2023    1:54 AM  CBC  WBC 4.0 - 10.5 K/uL 4.5  3.5  4.2   Hemoglobin 13.0 - 17.0 g/dL 60.6  30.1  60.1   Hematocrit 39.0 - 52.0 % 37.5  36.4  34.7   Platelets 150 - 400 K/uL 170  274  243        Latest Ref Rng & Units 06/22/2023    8:40 AM 06/21/2023    5:23 AM 06/19/2023    1:21 AM  CMP   Glucose 70 - 99 mg/dL 093  235  573   BUN 6 - 20 mg/dL 10  10  10    Creatinine 0.61 - 1.24 mg/dL 2.20  2.54  2.70   Sodium 135 - 145 mmol/L 139  134  133   Potassium 3.5 - 5.1 mmol/L 3.7  4.2  3.5   Chloride 98 - 111 mmol/L 103  102  95   CO2 22 - 32 mmol/L 26  22  25    Calcium 8.9 - 10.3 mg/dL 9.1  8.5  8.6     DG Hand Complete Right  Result Date: 06/19/2023 CLINICAL DATA:  Hand surgery 2 months ago. Believes staph infection is back. Infection of the distal third metacarpal. Pain. And is swollen and warm. EXAM: RIGHT HAND - COMPLETE 3+ VIEW COMPARISON:  Radiograph 05/23/2023 FINDINGS: Digits are held in flexion on all images. No definite fracture or dislocation. No evidence of osteomyelitis. Soft tissue swelling about the third finger. IMPRESSION: Soft tissue swelling about the third finger without radiographic evidence of osteomyelitis. Electronically Signed   By: Minerva Fester M.D.   On: 06/19/2023 01:05     Signed: Carmina Miller, DO  Internal Medicine Resident, PGY-1 [facility]  Pager: [phone] 12:54 PM, 06/22/2023

## 2023-06-23 ENCOUNTER — Encounter (HOSPITAL_COMMUNITY): Payer: Self-pay | Admitting: General Surgery

## 2023-06-23 NOTE — Anesthesia Postprocedure Evaluation (Signed)
Anesthesia Post Note  Patient: Duane King  Procedure(s) Performed: IRRIGATION AND DEBRIDEMENT, HAND (Right)     Patient location during evaluation: PACU Anesthesia Type: MAC Level of consciousness: awake and alert Pain management: pain level controlled Vital Signs Assessment: post-procedure vital signs reviewed and stable Respiratory status: spontaneous breathing, nonlabored ventilation, respiratory function stable and patient connected to nasal cannula oxygen Cardiovascular status: stable and blood pressure returned to baseline Postop Assessment: no apparent nausea or vomiting Anesthetic complications: no   No notable events documented.  Last Vitals:  Vitals:   06/22/23 0308 06/22/23 0858  BP: (!) 93/58 109/64  Pulse: 61 64  Resp:  18  Temp: (!) 36.4 C 36.6 C  SpO2: 98% 100%    Last Pain:  Vitals:   06/22/23 1613  TempSrc:   PainSc: 8                  Tashianna Broome S

## 2023-06-24 LAB — CULTURE, BLOOD (ROUTINE X 2)
Culture: NO GROWTH
Culture: NO GROWTH
Special Requests: ADEQUATE
Special Requests: ADEQUATE

## 2023-06-30 ENCOUNTER — Emergency Department (HOSPITAL_COMMUNITY): Payer: Medicaid Other

## 2023-06-30 ENCOUNTER — Encounter (HOSPITAL_COMMUNITY): Payer: Self-pay | Admitting: Emergency Medicine

## 2023-06-30 ENCOUNTER — Other Ambulatory Visit: Payer: Self-pay

## 2023-06-30 ENCOUNTER — Emergency Department (HOSPITAL_COMMUNITY)
Admission: EM | Admit: 2023-06-30 | Discharge: 2023-06-30 | Disposition: A | Discharge status: Home / Self Care | Payer: Medicaid Other | Attending: Emergency Medicine | Admitting: Emergency Medicine

## 2023-06-30 DIAGNOSIS — S59912A Unspecified injury of left forearm, initial encounter: Secondary | ICD-10-CM | POA: Diagnosis present

## 2023-06-30 DIAGNOSIS — S51812A Laceration without foreign body of left forearm, initial encounter: Secondary | ICD-10-CM | POA: Diagnosis not present

## 2023-06-30 DIAGNOSIS — S51832A Puncture wound without foreign body of left forearm, initial encounter: Secondary | ICD-10-CM

## 2023-06-30 LAB — COMPREHENSIVE METABOLIC PANEL
ALT: 56 U/L — ABNORMAL HIGH (ref 0–44)
AST: 31 U/L (ref 15–41)
Albumin: 3.5 g/dL (ref 3.5–5.0)
Alkaline Phosphatase: 62 U/L (ref 38–126)
Anion gap: 9 (ref 5–15)
BUN: 9 mg/dL (ref 6–20)
CO2: 27 mmol/L (ref 22–32)
Calcium: 8.8 mg/dL — ABNORMAL LOW (ref 8.9–10.3)
Chloride: 103 mmol/L (ref 98–111)
Creatinine, Ser: 1.04 mg/dL (ref 0.61–1.24)
GFR, Estimated: >60 mL/min (ref >60)
Glucose, Bld: 128 mg/dL — ABNORMAL HIGH (ref 70–99)
Potassium: 3.8 mmol/L (ref 3.5–5.1)
Sodium: 139 mmol/L (ref 135–145)
Total Bilirubin: 0.5 mg/dL (ref 0.3–1.2)
Total Protein: 7.3 g/dL (ref 6.5–8.1)

## 2023-06-30 LAB — ETHANOL: Alcohol, Ethyl (B): <10 mg/dL (ref <10)

## 2023-06-30 LAB — CBC
HCT: 33.5 % — ABNORMAL LOW (ref 39.0–52.0)
Hemoglobin: 10.6 g/dL — ABNORMAL LOW (ref 13.0–17.0)
MCH: 28.3 pg (ref 26.0–34.0)
MCHC: 31.6 g/dL (ref 30.0–36.0)
MCV: 89.6 fL (ref 80.0–100.0)
Platelets: 385 10*3/uL (ref 150–400)
RBC: 3.74 MIL/uL — ABNORMAL LOW (ref 4.22–5.81)
RDW: 12.8 % (ref 11.5–15.5)
WBC: 7.1 10*3/uL (ref 4.0–10.5)
nRBC: 0 % (ref 0.0–0.2)

## 2023-06-30 MED ORDER — TETANUS-DIPHTH-ACELL PERTUSSIS 5-2.5-18.5 LF-MCG/0.5 IM SUSY
0.5000 mL | PREFILLED_SYRINGE | Freq: Once | INTRAMUSCULAR | Status: DC
  Filled 2023-06-30: qty 0.5

## 2023-06-30 MED ORDER — LIDOCAINE HCL (PF) 1 % IJ SOLN
30.0000 mL | Freq: Once | INTRAMUSCULAR | Status: AC
  Administered 2023-06-30: 30 mL
  Filled 2023-06-30: qty 30

## 2023-06-30 MED ORDER — FENTANYL CITRATE PF 50 MCG/ML IJ SOSY
25.0000 ug | PREFILLED_SYRINGE | Freq: Once | INTRAMUSCULAR | Status: AC
  Administered 2023-06-30: 25 ug via INTRAVENOUS
  Filled 2023-06-30: qty 1

## 2023-06-30 MED ORDER — SODIUM CHLORIDE 0.9 % IV BOLUS
1000.0000 mL | Freq: Once | INTRAVENOUS | Status: AC
  Administered 2023-06-30: 1000 mL via INTRAVENOUS

## 2023-06-30 MED ORDER — CEFAZOLIN SODIUM-DEXTROSE 2-4 GM/100ML-% IV SOLN
2.0000 g | Freq: Once | INTRAVENOUS | Status: AC
  Administered 2023-06-30: 2 g via INTRAVENOUS
  Filled 2023-06-30: qty 100
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
## 2023-06-30 NOTE — Discharge Instructions (Signed)
With your wound it is important to follow-up with our orthopedic surgery colleague in 2 days.  Please call the office tomorrow to ensure timing for that visit.  Return here for concerning changes in your condition.

## 2023-07-05 ENCOUNTER — Encounter: Payer: Medicaid Other | Admitting: Student

## 2024-11-13 ENCOUNTER — Ambulatory Visit: Payer: Self-pay

## 2024-12-14 ENCOUNTER — Ambulatory Visit: Payer: Self-pay

## 2024-12-14 ENCOUNTER — Ambulatory Visit: Payer: Self-pay | Admitting: Pharmacist

## 2024-12-14 ENCOUNTER — Ambulatory Visit: Payer: Self-pay | Admitting: Family
